# Patient Record
Sex: Male | Born: 1990 | Race: Black or African American | Hispanic: No | Marital: Single | Smoking: Never smoker
Health system: Southern US, Community
[De-identification: ages and names within clinical notes are randomized; demographics above are authoritative.]

## PROBLEM LIST (undated history)

## (undated) DIAGNOSIS — K3580 Unspecified acute appendicitis: Secondary | ICD-10-CM

## (undated) HISTORY — PX: TYMPANOSTOMY TUBE PLACEMENT: SHX32

## (undated) HISTORY — PX: TONSILLECTOMY: SUR1361

---

## 2013-05-30 ENCOUNTER — Encounter (HOSPITAL_COMMUNITY): Payer: Self-pay | Admitting: Emergency Medicine

## 2013-05-30 ENCOUNTER — Emergency Department (HOSPITAL_COMMUNITY): Payer: BC Managed Care – PPO

## 2013-05-30 ENCOUNTER — Ambulatory Visit (HOSPITAL_COMMUNITY)
Admission: EM | Admit: 2013-05-30 | Discharge: 2013-05-31 | Disposition: A | Discharge status: Home / Self Care | Payer: BC Managed Care – PPO | Attending: General Surgery | Admitting: General Surgery

## 2013-05-30 ENCOUNTER — Emergency Department (HOSPITAL_COMMUNITY)
Admission: EM | Admit: 2013-05-30 | Discharge: 2013-05-30 | Disposition: A | Discharge status: Nursing Home (Includes ICF) | Payer: BC Managed Care – PPO | Source: Home / Self Care

## 2013-05-30 ENCOUNTER — Emergency Department (HOSPITAL_COMMUNITY): Payer: BC Managed Care – PPO | Admitting: Anesthesiology

## 2013-05-30 ENCOUNTER — Encounter (HOSPITAL_COMMUNITY)
Admission: EM | Disposition: A | Discharge status: Home / Self Care | Payer: Self-pay | Source: Home / Self Care | Attending: Emergency Medicine

## 2013-05-30 ENCOUNTER — Encounter (HOSPITAL_COMMUNITY): Payer: Self-pay | Admitting: Anesthesiology

## 2013-05-30 DIAGNOSIS — K3580 Unspecified acute appendicitis: Secondary | ICD-10-CM | POA: Insufficient documentation

## 2013-05-30 DIAGNOSIS — R1031 Right lower quadrant pain: Secondary | ICD-10-CM

## 2013-05-30 HISTORY — PX: LAPAROSCOPIC APPENDECTOMY: SHX408

## 2013-05-30 HISTORY — DX: Unspecified acute appendicitis: K35.80

## 2013-05-30 LAB — COMPREHENSIVE METABOLIC PANEL
ALT: 21 U/L (ref 0–53)
AST: 18 U/L (ref 0–37)
Albumin: 4.3 g/dL (ref 3.5–5.2)
Alkaline Phosphatase: 53 U/L (ref 39–117)
GFR calc Af Amer: >90 mL/min (ref >90)
Glucose, Bld: 95 mg/dL (ref 70–99)
Potassium: 3.4 mEq/L — ABNORMAL LOW (ref 3.5–5.1)
Sodium: 138 mEq/L (ref 135–145)
Total Protein: 8.5 g/dL — ABNORMAL HIGH (ref 6.0–8.3)

## 2013-05-30 LAB — URINALYSIS, ROUTINE W REFLEX MICROSCOPIC
Glucose, UA: NEGATIVE mg/dL
Hgb urine dipstick: NEGATIVE
Specific Gravity, Urine: 1.027 (ref 1.005–1.030)
pH: 6.5 (ref 5.0–8.0)

## 2013-05-30 LAB — CBC WITH DIFFERENTIAL/PLATELET
Basophils Absolute: 0 10*3/uL (ref 0.0–0.1)
Eosinophils Absolute: 0.1 10*3/uL (ref 0.0–0.7)
Lymphs Abs: 3.1 10*3/uL (ref 0.7–4.0)
MCH: 27.8 pg (ref 26.0–34.0)
Neutrophils Relative %: 64 % (ref 43–77)
Platelets: 243 10*3/uL (ref 150–400)
RBC: 5.44 MIL/uL (ref 4.22–5.81)
WBC: 12.7 10*3/uL — ABNORMAL HIGH (ref 4.0–10.5)

## 2013-05-30 SURGERY — APPENDECTOMY, LAPAROSCOPIC
Anesthesia: General | Site: Abdomen | Wound class: Clean Contaminated

## 2013-05-30 MED ORDER — PROPOFOL 10 MG/ML IV BOLUS
INTRAVENOUS | Status: DC | PRN
  Administered 2013-05-30: 300 mg via INTRAVENOUS

## 2013-05-30 MED ORDER — LIDOCAINE HCL (CARDIAC) 20 MG/ML IV SOLN
INTRAVENOUS | Status: DC | PRN
  Administered 2013-05-30: 20 mg via INTRAVENOUS

## 2013-05-30 MED ORDER — SUFENTANIL CITRATE 50 MCG/ML IV SOLN
INTRAVENOUS | Status: DC | PRN
  Administered 2013-05-30: 10 ug via INTRAVENOUS
  Administered 2013-05-30 (×2): 15 ug via INTRAVENOUS

## 2013-05-30 MED ORDER — IOHEXOL 300 MG/ML  SOLN
25.0000 mL | INTRAMUSCULAR | Status: DC | PRN
  Administered 2013-05-30: 25 mL via ORAL

## 2013-05-30 MED ORDER — LACTATED RINGERS IV SOLN
INTRAVENOUS | Status: DC | PRN
  Administered 2013-05-30 (×3): via INTRAVENOUS

## 2013-05-30 MED ORDER — OXYCODONE HCL 5 MG/5ML PO SOLN: 5.0000 mg | Freq: Once | ORAL | Status: AC | PRN

## 2013-05-30 MED ORDER — OXYCODONE HCL 5 MG PO TABS
5.0000 mg | ORAL_TABLET | Freq: Once | ORAL | Status: AC | PRN
Start: 2013-05-30 — End: 2013-05-30
  Administered 2013-05-30: 5 mg via ORAL

## 2013-05-30 MED ORDER — BSS IO SOLN
INTRAOCULAR | Status: AC
  Filled 2013-05-30: qty 15

## 2013-05-30 MED ORDER — ONDANSETRON HCL 4 MG/2ML IJ SOLN: 4.0000 mg | Freq: Four times a day (QID) | INTRAMUSCULAR | Status: DC | PRN

## 2013-05-30 MED ORDER — OXYCODONE HCL 5 MG PO TABS
ORAL_TABLET | ORAL | Status: AC
  Filled 2013-05-30: qty 1

## 2013-05-30 MED ORDER — MORPHINE SULFATE 4 MG/ML IJ SOLN: 1.0000 mg | INTRAMUSCULAR | Status: DC | PRN

## 2013-05-30 MED ORDER — HYDROMORPHONE HCL PF 1 MG/ML IJ SOLN
INTRAMUSCULAR | Status: AC
  Filled 2013-05-30: qty 1

## 2013-05-30 MED ORDER — PIPERACILLIN-TAZOBACTAM 3.375 G IVPB
3.3750 g | Freq: Once | INTRAVENOUS | Status: AC
  Administered 2013-05-30: 3.375 g via INTRAVENOUS
  Filled 2013-05-30: qty 50

## 2013-05-30 MED ORDER — HYDROMORPHONE HCL PF 1 MG/ML IJ SOLN
0.2500 mg | INTRAMUSCULAR | Status: DC | PRN
  Administered 2013-05-30 (×4): 0.5 mg via INTRAVENOUS

## 2013-05-30 MED ORDER — SUCCINYLCHOLINE CHLORIDE 20 MG/ML IJ SOLN
INTRAMUSCULAR | Status: DC | PRN
  Administered 2013-05-30: 200 mg via INTRAVENOUS

## 2013-05-30 MED ORDER — GLYCOPYRROLATE 0.2 MG/ML IJ SOLN
INTRAMUSCULAR | Status: DC | PRN
  Administered 2013-05-30: .6 mg via INTRAVENOUS

## 2013-05-30 MED ORDER — ONDANSETRON HCL 4 MG/2ML IJ SOLN
4.0000 mg | INTRAMUSCULAR | Status: AC
  Administered 2013-05-30: 4 mg via INTRAVENOUS
  Filled 2013-05-30: qty 2

## 2013-05-30 MED ORDER — NEOSTIGMINE METHYLSULFATE 1 MG/ML IJ SOLN
INTRAMUSCULAR | Status: DC | PRN
  Administered 2013-05-30: 5 mg via INTRAVENOUS

## 2013-05-30 MED ORDER — ONDANSETRON HCL 4 MG/2ML IJ SOLN
INTRAMUSCULAR | Status: DC | PRN
  Administered 2013-05-30: 4 mg via INTRAVENOUS

## 2013-05-30 MED ORDER — POTASSIUM CHLORIDE IN NACL 20-0.9 MEQ/L-% IV SOLN
INTRAVENOUS | Status: DC
  Administered 2013-05-30: 100 mL/h via INTRAVENOUS
  Filled 2013-05-30 (×5): qty 1000

## 2013-05-30 MED ORDER — HYDROCODONE-ACETAMINOPHEN 5-325 MG PO TABS
1.0000 | ORAL_TABLET | ORAL | Status: DC | PRN
  Administered 2013-05-30 – 2013-05-31 (×2): 1 via ORAL
  Administered 2013-05-31: 2 via ORAL
  Filled 2013-05-30 (×2): qty 1
  Filled 2013-05-30: qty 2

## 2013-05-30 MED ORDER — MIDAZOLAM HCL 2 MG/2ML IJ SOLN: 0.5000 mg | Freq: Once | INTRAMUSCULAR | Status: DC | PRN

## 2013-05-30 MED ORDER — IOHEXOL 300 MG/ML  SOLN
100.0000 mL | Freq: Once | INTRAMUSCULAR | Status: AC | PRN
  Administered 2013-05-30: 100 mL via INTRAVENOUS

## 2013-05-30 MED ORDER — BUPIVACAINE-EPINEPHRINE 0.25% -1:200000 IJ SOLN
INTRAMUSCULAR | Status: DC | PRN
  Administered 2013-05-30: 30 mL

## 2013-05-30 MED ORDER — SODIUM CHLORIDE 0.9 % IV BOLUS (SEPSIS)
1000.0000 mL | Freq: Once | INTRAVENOUS | Status: AC
  Administered 2013-05-30: 1000 mL via INTRAVENOUS

## 2013-05-30 MED ORDER — MIDAZOLAM HCL 5 MG/5ML IJ SOLN
INTRAMUSCULAR | Status: DC | PRN
  Administered 2013-05-30: 2 mg via INTRAVENOUS

## 2013-05-30 MED ORDER — ROCURONIUM BROMIDE 100 MG/10ML IV SOLN
INTRAVENOUS | Status: DC | PRN
  Administered 2013-05-30: 10 mg via INTRAVENOUS
  Administered 2013-05-30: 30 mg via INTRAVENOUS
  Administered 2013-05-30 (×2): 10 mg via INTRAVENOUS

## 2013-05-30 MED ORDER — MORPHINE SULFATE 4 MG/ML IJ SOLN
4.0000 mg | Freq: Once | INTRAMUSCULAR | Status: AC
  Administered 2013-05-30: 4 mg via INTRAVENOUS
  Filled 2013-05-30: qty 1

## 2013-05-30 MED ORDER — SODIUM CHLORIDE 0.9 % IR SOLN
Status: DC | PRN
  Administered 2013-05-30 (×2): 1000 mL

## 2013-05-30 MED ORDER — 0.9 % SODIUM CHLORIDE (POUR BTL) OPTIME
TOPICAL | Status: DC | PRN
  Administered 2013-05-30: 1000 mL

## 2013-05-30 MED ORDER — PIPERACILLIN-TAZOBACTAM 3.375 G IVPB 30 MIN
3.3750 g | Freq: Three times a day (TID) | INTRAVENOUS | Status: AC
  Administered 2013-05-30: 3.375 g via INTRAVENOUS
  Filled 2013-05-30: qty 50

## 2013-05-30 MED ORDER — ENOXAPARIN SODIUM 40 MG/0.4ML ~~LOC~~ SOLN
40.0000 mg | SUBCUTANEOUS | Status: DC
  Filled 2013-05-30: qty 0.4

## 2013-05-30 MED ORDER — ACETAMINOPHEN 500 MG PO TABS
1000.0000 mg | ORAL_TABLET | Freq: Four times a day (QID) | ORAL | Status: DC
  Administered 2013-05-30 – 2013-05-31 (×3): 1000 mg via ORAL
  Filled 2013-05-30 (×4): qty 2

## 2013-05-30 MED ORDER — MEPERIDINE HCL 25 MG/ML IJ SOLN: 6.2500 mg | INTRAMUSCULAR | Status: DC | PRN

## 2013-05-30 MED ORDER — PROMETHAZINE HCL 25 MG/ML IJ SOLN: 6.2500 mg | INTRAMUSCULAR | Status: DC | PRN

## 2013-05-30 MED ORDER — HYDROMORPHONE HCL PF 1 MG/ML IJ SOLN
1.0000 mg | INTRAMUSCULAR | Status: DC | PRN
  Administered 2013-05-30: 1 mg via INTRAVENOUS
  Filled 2013-05-30: qty 1

## 2013-05-30 SURGICAL SUPPLY — 45 items
APPLIER CLIP ROT 10 11.4 M/L (STAPLE)
BLADE SURG ROTATE 9660 (MISCELLANEOUS) IMPLANT
CANISTER SUCTION 2500CC (MISCELLANEOUS) ×2 IMPLANT
CHLORAPREP W/TINT 26ML (MISCELLANEOUS) ×2 IMPLANT
CLIP APPLIE ROT 10 11.4 M/L (STAPLE) IMPLANT
CLOTH BEACON ORANGE TIMEOUT ST (SAFETY) ×2 IMPLANT
COVER SURGICAL LIGHT HANDLE (MISCELLANEOUS) ×2 IMPLANT
CUTTER LINEAR ENDO 35 ETS (STAPLE) IMPLANT
CUTTER LINEAR ENDO 35 ETS TH (STAPLE) ×2 IMPLANT
DECANTER SPIKE VIAL GLASS SM (MISCELLANEOUS) ×2 IMPLANT
DERMABOND ADVANCED (GAUZE/BANDAGES/DRESSINGS) ×1
DERMABOND ADVANCED .7 DNX12 (GAUZE/BANDAGES/DRESSINGS) ×1 IMPLANT
DRAPE UTILITY 15X26 W/TAPE STR (DRAPE) ×4 IMPLANT
DRSG TEGADERM 2-3/8X2-3/4 SM (GAUZE/BANDAGES/DRESSINGS) ×6 IMPLANT
ELECT REM PT RETURN 9FT ADLT (ELECTROSURGICAL) ×2
ELECTRODE REM PT RTRN 9FT ADLT (ELECTROSURGICAL) ×1 IMPLANT
ENDOLOOP SUT PDS II  0 18 (SUTURE)
ENDOLOOP SUT PDS II 0 18 (SUTURE) IMPLANT
GLOVE BIO SURGEON STRL SZ 6 (GLOVE) ×4 IMPLANT
GLOVE BIOGEL PI IND STRL 8 (GLOVE) ×1 IMPLANT
GLOVE BIOGEL PI INDICATOR 8 (GLOVE) ×1
GLOVE ECLIPSE 7.5 STRL STRAW (GLOVE) ×2 IMPLANT
GOWN STRL NON-REIN LRG LVL3 (GOWN DISPOSABLE) ×6 IMPLANT
IV NS 1000ML (IV SOLUTION) ×2
IV NS 1000ML BAXH (IV SOLUTION) ×2 IMPLANT
KIT BASIN OR (CUSTOM PROCEDURE TRAY) ×2 IMPLANT
KIT ROOM TURNOVER OR (KITS) ×2 IMPLANT
NS IRRIG 1000ML POUR BTL (IV SOLUTION) ×2 IMPLANT
PAD ARMBOARD 7.5X6 YLW CONV (MISCELLANEOUS) ×4 IMPLANT
PENCIL BUTTON HOLSTER BLD 10FT (ELECTRODE) IMPLANT
POUCH SPECIMEN RETRIEVAL 10MM (ENDOMECHANICALS) ×2 IMPLANT
RELOAD /EVU35 (ENDOMECHANICALS) IMPLANT
RELOAD CUTTER ETS 35MM STAND (ENDOMECHANICALS) ×2 IMPLANT
SET IRRIG TUBING LAPAROSCOPIC (IRRIGATION / IRRIGATOR) ×2 IMPLANT
SPECIMEN JAR SMALL (MISCELLANEOUS) ×2 IMPLANT
STRIP CLOSURE SKIN 1/2X4 (GAUZE/BANDAGES/DRESSINGS) ×2 IMPLANT
SUT MNCRL AB 4-0 PS2 18 (SUTURE) ×2 IMPLANT
TOWEL OR 17X24 6PK STRL BLUE (TOWEL DISPOSABLE) ×2 IMPLANT
TOWEL OR 17X26 10 PK STRL BLUE (TOWEL DISPOSABLE) ×2 IMPLANT
TRAY FOLEY CATH 16FR SILVER (SET/KITS/TRAYS/PACK) ×2 IMPLANT
TRAY LAPAROSCOPIC (CUSTOM PROCEDURE TRAY) ×2 IMPLANT
TROCAR XCEL 12X100 BLDLESS (ENDOMECHANICALS) ×2 IMPLANT
TROCAR XCEL BLUNT TIP 100MML (ENDOMECHANICALS) ×2 IMPLANT
TROCAR XCEL NON-BLD 5MMX100MML (ENDOMECHANICALS) ×2 IMPLANT
WATER STERILE IRR 1000ML POUR (IV SOLUTION) IMPLANT

## 2013-05-30 NOTE — Op Note (Signed)
OPERATIVE REPORT  DATE OF OPERATION: 05/30/2013  PATIENT:  Scott Cunningham  22 y.o. male  PRE-OPERATIVE DIAGNOSIS:  acute appendicitis  POST-OPERATIVE DIAGNOSIS:  acute appendicitis  PROCEDURE:  Procedure(s): APPENDECTOMY LAPAROSCOPIC  SURGEON:  Surgeon(s): Cherylynn Ridges, MD  ASSISTANT: None  ANESTHESIA:   general  EBL: 50 ml  BLOOD ADMINISTERED: none  DRAINS: none   SPECIMEN:  Source of Specimen:  Acutely inflammed appendix  COUNTS CORRECT:  YES  PROCEDURE DETAILS: The patient was taken to the operating room and placed on the table in the supine position.  After an adequate general endotracheal anesthetic was administered he was prepped and draped in the usual sterile manner exposing the entire abdomen.  After proper time out was performed identifying the patient and the procedure to be performed, a supraumbilical midline incision was made approximately 1/2-2 cm long using a #15 blade, and taken down to the midline fascia. We incised the midline fascia using a #15 blade and grabbed the edges with a Kocher clamp. We bluntly dissected down into the peritoneal cavity then passed a pursestring suture of 0 Vicryl around the fascial opening. A Hassan cannula was subsequently passed into the peritoneal cavity and secured in place with a pursestring suture.  Carbon dioxide gas was insufflated into the peritoneal cavity up to maximal intra-abdominal pressure 15 mm mercury. We subsequently passed a right upper quadrant 5 mm cannula in the left lower quadrant 1112 mm cannula under direct vision. The patient was placed in Trendelenburg position and the left side was tilted down.  The appendix was densely adhesed to the right lateral paracolic wall and the terminal ileum was draped over the appendix from adhesive bands and scar tissue. The portion of the procedure was been trying to dissect away the terminal ileum from the appendix which were able to do safely without any injury to the  bowel.  We were subsequently able to dissect out the mesoappendix and come across with an Endo GIA with 2.5 mm closure staples. We subsequently dissected the appendix away from surrounding structures and at the base of the cecum came across the appendix with a 3.5 mm blue cartridge Endo GIA. This completely detached appendix which we retrieved from the left lower quadrant site using an Endo Catch bag.  Once the appendix was removed we irrigated with approximately 750 cc of saline attaining adequate hemostasis using electrocautery. We subsequently removed the supraumbilical cannula using the pursestring suture to close off the fascial defect. We aspirated all fluid and gas from above the liver then closed.  Quarter percent Marcaine with an epinephrine was injected at all sites. We then closed the skin in the left lower quadrant in the suprabuccal site using running subcuticular stitch of 4-0 Monocryl. Dermabond Steri-Strips and Tegaderms use complete our dressings all needle counts, sponge counts, and instrument counts were correct.  PATIENT DISPOSITION:  PACU - hemodynamically stable.   Cherylynn Ridges 9/5/20145:06 PM

## 2013-05-30 NOTE — Transfer of Care (Signed)
Immediate Anesthesia Transfer of Care Note  Patient: Scott Cunningham  Procedure(s) Performed: Procedure(s): APPENDECTOMY LAPAROSCOPIC (N/A)  Patient Location: PACU  Anesthesia Type:General  Level of Consciousness: awake, oriented, sedated and patient cooperative  Airway & Oxygen Therapy: Patient Spontanous Breathing and Patient connected to face mask oxygen  Post-op Assessment: Report given to PACU RN, Post -op Vital signs reviewed and stable and Patient moving all extremities  Post vital signs: Reviewed and stable  Complications: No apparent anesthesia complications

## 2013-05-30 NOTE — ED Notes (Addendum)
Pt's mother phone #[phone], OR receiving RN Olegario Messier informed that Dr. Lynnea Maizes would like to speak with pt's mother.

## 2013-05-30 NOTE — Progress Notes (Signed)
Patient ID: Scott Cunningham, male   DOB: [birth year], 22 y.o.   MRN: [id number] Rome Echavarria [birth year]  [id number].   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  He woke up yesterday with RLQ abdominal pain.  He had 4 episodes of emesis last night, but none since then.  His pain has persisted with no fevers or chills.  He came to the [facility] today and underwent a work up that revealed acute appendicitis.  We have been asked to see him for intervention.  Review of Systems: Please see HPI, otherwise all other systems are negative  No family history on file.  History reviewed. No pertinent past medical history.  History reviewed. No pertinent past surgical history.  Social History:  reports that he has never smoked. He does not have any smokeless tobacco history on file. He reports that he does not drink alcohol or use illicit drugs.  Allergies: No Known Allergies   (Not in a hospital admission)  Blood pressure 122/47, pulse 91, temperature 99 F (37.2 C), temperature source Oral, resp. rate 20, height 5\' 9"  (1.753 m), weight 330 lb (149.687 kg), SpO2 100.00%. Physical Exam: General: pleasant, obese black male who is laying in bed in NAD HEENT: head is normocephalic, atraumatic.  Sclera are noninjected.  PERRL.  Ears and nose without any masses or lesions.  Mouth is pink and moist Heart: regular, rate, and rhythm.  Normal s1,s2. No obvious murmurs, gallops, or rubs noted.  Palpable radial and pedal pulses bilaterally Lungs: CTAB, no wheezes, rhonchi, or rales noted.  Respiratory effort nonlabored Abd: soft, tender in RLQ with no guarding or rebouding, ND, hypoactive BS, no masses, hernias, or organomegaly MS: all 4 extremities are symmetrical with no cyanosis, clubbing, or edema. Skin: warm and dry with no masses, lesions, or rashes Psych: A&Ox3 with an appropriate  affect.    Results for orders placed during the hospital encounter of 05/30/13 (from the past 48 hour(s))  CBC WITH DIFFERENTIAL     Status: Abnormal   Collection Time    05/30/13 11:25 AM      Result Value Range   WBC 12.7 (*) 4.0 - 10.5 K/uL   RBC 5.44  4.22 - 5.81 MIL/uL   Hemoglobin 15.1  13.0 - 17.0 g/dL   HCT 69.6  29.5 - 28.4 %   MCV 80.3  78.0 - 100.0 fL   MCH 27.8  26.0 - 34.0 pg   MCHC 34.6  30.0 - 36.0 g/dL   RDW 13.2  44.0 - 10.2 %   Platelets 243  150 - 400 K/uL   Neutrophils Relative % 64  43 - 77 %   Neutro Abs 8.2 (*) 1.7 - 7.7 K/uL   Lymphocytes Relative 25  12 - 46 %   Lymphs Abs 3.1  0.7 - 4.0 K/uL   Monocytes Relative 10  3 - 12 %   Monocytes Absolute 1.3 (*) 0.1 - 1.0 K/uL   Eosinophils Relative 1  0 - 5 %   Eosinophils Absolute 0.1  0.0 - 0.7 K/uL   Basophils Relative 0  0 - 1 %   Basophils Absolute 0.0  0.0 - 0.1 K/uL  COMPREHENSIVE METABOLIC PANEL     Status: Abnormal   Collection Time    05/30/13 11:25 AM      Result Value Range   Sodium 138  135 - 145 mEq/L  Potassium 3.4 (*) 3.5 - 5.1 mEq/L   Chloride 99  96 - 112 mEq/L   CO2 27  19 - 32 mEq/L   Glucose, Bld 95  70 - 99 mg/dL   BUN 7  6 - 23 mg/dL   Creatinine, Ser 1.61  0.50 - 1.35 mg/dL   Calcium 09.6  8.4 - 04.5 mg/dL   Total Protein 8.5 (*) 6.0 - 8.3 g/dL   Albumin 4.3  3.5 - 5.2 g/dL   AST 18  0 - 37 U/L   ALT 21  0 - 53 U/L   Alkaline Phosphatase 53  39 - 117 U/L   Total Bilirubin 0.6  0.3 - 1.2 mg/dL   GFR calc non Af Amer >90  >90 mL/min   GFR calc Af Amer >90  >90 mL/min   Comment: (NOTE)     The eGFR has been calculated using the CKD EPI equation.     This calculation has not been validated in all clinical situations.     eGFR's persistently <90 mL/min signify possible Chronic Kidney     Disease.  LIPASE, BLOOD     Status: None   Collection Time    05/30/13 11:25 AM      Result Value Range   Lipase 15  11 - 59 U/L  URINALYSIS, ROUTINE W REFLEX MICROSCOPIC     Status:  Abnormal   Collection Time    05/30/13 11:31 AM      Result Value Range   Color, Urine AMBER (*) YELLOW   Comment: BIOCHEMICALS MAY BE AFFECTED BY COLOR   APPearance CLEAR  CLEAR   Specific Gravity, Urine 1.027  1.005 - 1.030   pH 6.5  5.0 - 8.0   Glucose, UA NEGATIVE  NEGATIVE mg/dL   Hgb urine dipstick NEGATIVE  NEGATIVE   Bilirubin Urine SMALL (*) NEGATIVE   Ketones, ur 15 (*) NEGATIVE mg/dL   Protein, ur NEGATIVE  NEGATIVE mg/dL   Urobilinogen, UA 2.0 (*) 0.0 - 1.0 mg/dL   Nitrite NEGATIVE  NEGATIVE   Leukocytes, UA NEGATIVE  NEGATIVE   Comment: MICROSCOPIC NOT DONE ON URINES WITH NEGATIVE PROTEIN, BLOOD, LEUKOCYTES, NITRITE, OR GLUCOSE <1000 mg/dL.   Ct Abdomen Pelvis W Contrast  05/30/2013   *RADIOLOGY REPORT*  Clinical Data: Lower abdominal pain with nausea and vomiting  CT ABDOMEN AND PELVIS WITH CONTRAST  Technique:  Multidetector CT imaging of the abdomen and pelvis was performed following the standard protocol during bolus administration of intravenous contrast. Oral contrast was also administered.  Contrast: OMNIPAQUE IOHEXOL 300 MG/ML  SOLN  Comparison: None.  Findings: Lung bases are clear.  There is mild fatty infiltration near the fissure for the ligamentum teres.  Liver otherwise appears normal.  There is no biliary duct dilatation.  Spleen, pancreas, adrenals, and kidneys appear normal.  In the pelvis, there is no mass or fluid collection.  The appendix is dilated with extensive periappendiceal inflammation consistent with acute appendicitis. There is a nearby thickening of the terminal ileal wall, probably due to the inflammation from the appendicitis.  There is no bowel obstruction.  No free air or portal venous air.  There is no ascites, adenopathy, or abscess in the pelvis.  Aorta is nonaneurysmal.  There are no blastic or lytic bone lesions.  IMPRESSION: Evidence of acute appendicitis.  There is thickening of the wall of the terminal ileum, probably due to the  surrounding periappendiceal inflammation.  Critical Value/emergent results were called by telephone at  the time of interpretation on May 30, 2013 at t01:24 p.m. to [facility], who verbally acknowledged these results.   Original Report Authenticated By: Bretta Bang, M.D.       Assessment/Plan 1. Acute appendicitis   Plan: 1. We will get the patient admitted and plan for lap appy today, possible open, for acute appendicitis.  I have discussed the procedure including risks, complications, and outcome with the patient.  All questions have been answered.  The patient understands and is agreeable to proceed.  Kaylianna Detert E 05/30/2013, 2:15 PM Pager: [phone]

## 2013-05-30 NOTE — ED Notes (Signed)
C/o RLQ pain with n/v. Acute onset yesterday. Pt has tried otc meds with no relief in symptoms.  Denies fever and diarrhea.

## 2013-05-30 NOTE — Anesthesia Procedure Notes (Signed)
Procedure Name: Intubation Date/Time: 05/30/2013 3:34 PM Performed by: Coralee Rud Pre-anesthesia Checklist: Patient identified, Emergency Drugs available, Suction available, Patient being monitored and Timeout performed Patient Re-evaluated:Patient Re-evaluated prior to inductionOxygen Delivery Method: Circle system utilized Preoxygenation: Pre-oxygenation with 100% oxygen Intubation Type: IV induction Ventilation: Mask ventilation without difficulty Laryngoscope Size: Miller and 3 Grade View: Grade II Tube type: Oral Tube size: 8.0 mm Number of attempts: 1 Airway Equipment and Method: Stylet Placement Confirmation: ETT inserted through vocal cords under direct vision,  positive ETCO2 and breath sounds checked- equal and bilateral Secured at: 24 cm Tube secured with: Tape Dental Injury: Teeth and Oropharynx as per pre-operative assessment

## 2013-05-30 NOTE — ED Provider Notes (Signed)
CSN: [id number]     Arrival date & time 05/30/13  1044 History   First MD Initiated Contact with Patient 05/30/13 1108     Chief Complaint  Patient presents with  . Abdominal Pain  . Nausea  . Emesis   (Consider location/radiation/quality/duration/timing/severity/associated sxs/prior Treatment) HPI Comments: Patient is a 22 year old male with no significant past medical or surgical history who presents for right lower quadrant pain onset yesterday morning. Patient states the pain is sharp, stabbing, aching and intermittent. Pain is nonradiating. Patient states the pain is worse when he is moving and when he is walking. Pain is mildly alleviated when at rest. Patient has taken Tylenol and Pepto-Bismol for his symptoms without relief. Patient denies nausea or vomiting with symptoms, however, revealed to urgent care prior to arrival that he had experienced 4 episodes of emesis. Patient further denies fever, diarrhea, melena or hematochezia, urinary symptoms, and chest pain or shortness of breath. Patient states his last bowel movement was 2 days ago which was normal in color and consistency.  Patient is a 22 y.o. male presenting with abdominal pain and vomiting. The history is provided by the patient. No language interpreter was used.  Abdominal Pain Associated symptoms: vomiting   Associated symptoms: no diarrhea, no dysuria, no fever, no hematuria and no nausea   Emesis Associated symptoms: abdominal pain   Associated symptoms: no diarrhea     History reviewed. No pertinent past medical history. History reviewed. No pertinent past surgical history. No family history on file. History  Substance Use Topics  . Smoking status: Never Smoker   . Smokeless tobacco: Not on file  . Alcohol Use: No    Review of Systems  Constitutional: Negative for fever.  Gastrointestinal: Positive for vomiting and abdominal pain. Negative for nausea and diarrhea.  Genitourinary: Negative for dysuria and  hematuria.  All other systems reviewed and are negative.   Allergies  Review of patient's allergies indicates no known allergies.  Home Medications   Current Outpatient Rx  Name  Route  Sig  Dispense  Refill  . acetaminophen (TYLENOL) 500 MG tablet   Oral   Take 500 mg by mouth every 6 (six) hours as needed for pain.         Marland Kitchen bismuth subsalicylate (PEPTO BISMOL) 262 MG/15ML suspension   Oral   Take 15 mLs by mouth every 6 (six) hours as needed for indigestion.          BP 109/49  Pulse 95  Temp(Src) 99 F (37.2 C) (Oral)  Resp 20  Ht 5\' 9"  (1.753 m)  Wt 330 lb (149.687 kg)  BMI 48.71 kg/m2  SpO2 97%  Physical Exam  Nursing note and vitals reviewed. Constitutional: He is oriented to person, place, and time. He appears well-developed and well-nourished. No distress.  HENT:  Head: Normocephalic and atraumatic.  Mouth/Throat: Oropharynx is clear and moist. No oropharyngeal exudate.  Eyes: Conjunctivae and EOM are normal. Pupils are equal, round, and reactive to light. No scleral icterus.  Neck: Normal range of motion.  Cardiovascular: Normal rate, regular rhythm and intact distal pulses.   Pulmonary/Chest: Effort normal and breath sounds normal. No respiratory distress. He has no wheezes. He has no rales.  Abdominal: Soft. He exhibits no mass. There is tenderness (Focal tenderness and right lower quadrant with deep palpation). There is no rebound and no guarding.  No peritoneal signs or evidence of acute surgical abdomen  Musculoskeletal: Normal range of motion.  Neurological: He is alert and  oriented to person, place, and time.  Skin: Skin is warm and dry. No rash noted. He is not diaphoretic. No erythema. No pallor.  Psychiatric: He has a normal mood and affect. His behavior is normal.    ED Course  Procedures (including critical care time) Labs Review Labs Reviewed  CBC WITH DIFFERENTIAL - Abnormal; Notable for the following:    WBC 12.7 (*)    Neutro Abs 8.2  (*)    Monocytes Absolute 1.3 (*)    All other components within normal limits  COMPREHENSIVE METABOLIC PANEL - Abnormal; Notable for the following:    Potassium 3.4 (*)    Total Protein 8.5 (*)    All other components within normal limits  URINALYSIS, ROUTINE W REFLEX MICROSCOPIC - Abnormal; Notable for the following:    Color, Urine AMBER (*)    Bilirubin Urine SMALL (*)    Ketones, ur 15 (*)    Urobilinogen, UA 2.0 (*)    All other components within normal limits  LIPASE, BLOOD   Imaging Review Ct Abdomen Pelvis W Contrast  05/30/2013   *RADIOLOGY REPORT*  Clinical Data: Lower abdominal pain with nausea and vomiting  CT ABDOMEN AND PELVIS WITH CONTRAST  Technique:  Multidetector CT imaging of the abdomen and pelvis was performed following the standard protocol during bolus administration of intravenous contrast. Oral contrast was also administered.  Contrast: OMNIPAQUE IOHEXOL 300 MG/ML  SOLN  Comparison: None.  Findings: Lung bases are clear.  There is mild fatty infiltration near the fissure for the ligamentum teres.  Liver otherwise appears normal.  There is no biliary duct dilatation.  Spleen, pancreas, adrenals, and kidneys appear normal.  In the pelvis, there is no mass or fluid collection.  The appendix is dilated with extensive periappendiceal inflammation consistent with acute appendicitis. There is a nearby thickening of the terminal ileal wall, probably due to the inflammation from the appendicitis.  There is no bowel obstruction.  No free air or portal venous air.  There is no ascites, adenopathy, or abscess in the pelvis.  Aorta is nonaneurysmal.  There are no blastic or lytic bone lesions.  IMPRESSION: Evidence of acute appendicitis.  There is thickening of the wall of the terminal ileum, probably due to the surrounding periappendiceal inflammation.  Critical Value/emergent results were called by telephone at the time of interpretation on May 30, 2013 at t01:24 p.m. to  [facility], who verbally acknowledged these results.   Original Report Authenticated By: Bretta Bang, M.D.   MDM   1. Acute appendicitis    Patient with right lower quadrant pain x2 days. Symptoms managed in ED with IVF, IV morphine and IV Zofran. Labs significant for leukocytosis of 12.7. CT scan of evidence of acute appendicitis with thickening of the terminal ileum wall. Radiologist questions perforation; however patient has no involuntary guarding or peritoneal signs on initial physical exam or reexamination. Will consult general surgery for admission. IV Zosyn ordered.   General surgery to evaluate and admit.  Antony Madura, PA-C 05/30/13 1336

## 2013-05-30 NOTE — Anesthesia Preprocedure Evaluation (Addendum)
Anesthesia Evaluation  Patient identified by MRN, date of birth, ID band Patient awake    Reviewed: Allergy & Precautions, H&P , NPO status , Patient's Chart, lab work & pertinent test results, reviewed documented beta blocker date and time   History of Anesthesia Complications Negative for: history of anesthetic complications  Airway Mallampati: II TM Distance: >3 FB Neck ROM: Full    Dental  (+) Teeth Intact and Dental Advisory Given   Pulmonary Recent URI , Resolved,  breath sounds clear to auscultation  Pulmonary exam normal       Cardiovascular negative cardio ROS  Rhythm:Regular Rate:Normal     Neuro/Psych negative neurological ROS     GI/Hepatic Neg liver ROS, N/V with acute appy   Endo/Other  Morbid obesity  Renal/GU negative Renal ROS     Musculoskeletal   Abdominal (+) + obese,   Peds  Hematology   Anesthesia Other Findings   Reproductive/Obstetrics                         Anesthesia Physical Anesthesia Plan  ASA: III and emergent  Anesthesia Plan: General   Post-op Pain Management:    Induction: Intravenous  Airway Management Planned: Oral ETT  Additional Equipment:   Intra-op Plan:   Post-operative Plan: Extubation in OR  Informed Consent: I have reviewed the patients History and Physical, chart, labs and discussed the procedure including the risks, benefits and alternatives for the proposed anesthesia with the patient or authorized representative who has indicated his/her understanding and acceptance.   Dental advisory given  Plan Discussed with: CRNA and Surgeon  Anesthesia Plan Comments: (Plan routine monitors, GETA with RSI)       Anesthesia Quick Evaluation

## 2013-05-30 NOTE — Anesthesia Postprocedure Evaluation (Signed)
  Anesthesia Post-op Note  Patient: Scott Cunningham  Procedure(s) Performed: Procedure(s): APPENDECTOMY LAPAROSCOPIC (N/A)  Patient Location: PACU  Anesthesia Type:General  Level of Consciousness: awake, alert  and oriented  Airway and Oxygen Therapy: Patient Spontanous Breathing and Patient connected to nasal cannula oxygen  Post-op Pain: mild  Post-op Assessment: Post-op Vital signs reviewed, Patient's Cardiovascular Status Stable, Respiratory Function Stable, Patent Airway and Pain level controlled  Post-op Vital Signs: stable  Complications: No apparent anesthesia complications

## 2013-05-30 NOTE — Progress Notes (Signed)
Patient has acute appendicitis without perforation.  For lap appy soon.  Scott Cunningham. Gae Bon, MD, FACS [phone] [facility]

## 2013-05-30 NOTE — ED Provider Notes (Signed)
CSN: [id number]     Arrival date & time 05/30/13  2130 History   First MD Initiated Contact with Patient 05/30/13 1014     Chief Complaint  Patient presents with  . Abdominal Pain    RLQ pain acute onset with n/v   (Consider location/radiation/quality/duration/timing/severity/associated sxs/prior Treatment) HPI Comments: 22 year old male developed right lower quadrant pain yesterday morning. The pain has been primarily constant throughout the remainder of yesterday last night and today. It is often exacerbated by movement sitting and lying supine. Sometimes it is improved with standing. Yesterday evening he experienced 4 episodes of vomiting, 2 of those episodes were after consuming Pepto-Bismol. He has had a decreased appetite over the past 24 hours. He states he has been lying balled up on the bed having pain. Denies any known fever.   History reviewed. No pertinent past medical history. History reviewed. No pertinent past surgical history. History reviewed. No pertinent family history. History  Substance Use Topics  . Smoking status: Never Smoker   . Smokeless tobacco: Not on file  . Alcohol Use: No    Review of Systems  Constitutional: Positive for activity change and appetite change. Negative for fever.  HENT: Negative.   Respiratory: Negative.   Cardiovascular: Negative.   Gastrointestinal: Positive for nausea, vomiting and abdominal pain. Negative for diarrhea and constipation.  Genitourinary: Negative.   Skin: Negative for rash.  Neurological: Negative.     Allergies  Review of patient's allergies indicates no known allergies.  Home Medications  No current outpatient prescriptions on file. BP 125/91  Pulse 116  Temp(Src) 98.4 F (36.9 C) (Oral)  Resp 20  SpO2 100% Physical Exam  Nursing note and vitals reviewed. Constitutional: He is oriented to person, place, and time. He appears well-developed and well-nourished. No distress.  Neck: Normal range of motion. Neck  supple.  Cardiovascular: Normal rate, regular rhythm and normal heart sounds.   Pulmonary/Chest: Breath sounds normal. He is in respiratory distress. He has no wheezes.  Abdominal: Soft. He exhibits no mass. There is tenderness. There is guarding. There is no rebound.  There is right lower quadrant tenderness with guarding. No rebound. Supine on the bed tapping on the bottom of the heel produces pain in the right lower quadrant, possibly suggesting peritoneal irritation.  Musculoskeletal: He exhibits no edema.  Neurological: He is alert and oriented to person, place, and time. He exhibits normal muscle tone.  Skin: Skin is warm.  Abdomen is moist during exam.  Psychiatric: He has a normal mood and affect.    ED Course  Procedures (including critical care time) Labs Review Labs Reviewed - No data to display Imaging Review No results found.  MDM   1. Right lower quadrant abdominal pain     This 22 year old male is being transferred to the tone when she department for evaluation of right lower quadrant pain began yesterday morning.   it hasbeen persistent throughout the past  24 hours. Last p.m. he had 4 episodes of vomiting but this did not persist throughout the night or  Morning. Differential includes appendicitis, gas, constipation.  Hayden Rasmussen, NP 05/30/13 1037

## 2013-05-30 NOTE — ED Provider Notes (Signed)
Medical screening examination/treatment/procedure(s) were performed by non-physician practitioner and as supervising physician I was immediately available for consultation/collaboration.  Shelda Jakes, MD 05/30/13 [phone]

## 2013-05-30 NOTE — ED Notes (Signed)
Pt c/o abdominal pain with n/v onset yesterday. Pt transferred from urgent care for further eval.

## 2013-05-30 NOTE — Preoperative (Signed)
Beta Blockers   Reason not to administer Beta Blockers:Not Applicable 

## 2013-05-31 ENCOUNTER — Encounter (HOSPITAL_COMMUNITY): Payer: Self-pay | Admitting: *Deleted

## 2013-05-31 MED ORDER — HYDROCODONE-ACETAMINOPHEN 5-325 MG PO TABS: 1.0000 | ORAL_TABLET | ORAL | Status: DC | PRN

## 2013-05-31 NOTE — ED Provider Notes (Signed)
Medical screening examination/treatment/procedure(s) were performed by resident physician or non-physician practitioner and as supervising physician I was immediately available for consultation/collaboration.   Prarthana Parlin DOUGLAS MD.   Yuvonne Lanahan D Caileigh Canche, MD 05/31/13 1107 

## 2013-05-31 NOTE — Progress Notes (Signed)
1 Day Post-Op   Assessment: s/p Procedure(s): APPENDECTOMY LAPAROSCOPIC Patient Active Problem List   Diagnosis Date Noted  . Appendicitis, acute 05/30/2013    Doing well and able to go home  Plan: Discharge  Subjective: Feels good, sitting up in chair, polished off nice breakfast, pain good control, wants to go home  Objective: Vital signs in last 24 hours: Temp:  [97.6 F (36.4 C)-99.9 F (37.7 C)] 98.4 F (36.9 C) (09/06 1000) Pulse Rate:  [75-104] 75 (09/06 1000) Resp:  [15-23] 18 (09/06 1000) BP: (109-138)/(43-91) 128/60 mmHg (09/06 1000) SpO2:  [91 %-100 %] 96 % (09/06 1000) Weight:  [330 lb (149.687 kg)] 330 lb (149.687 kg) (09/05 1048)   Intake/Output from previous day: 09/05 0701 - 09/06 0700 In: 2558.3 [P.O.:200; I.V.:2358.3] Out: 1050 [Urine:1050]  General appearance: alert, cooperative and no distress Resp: clear to auscultation bilaterally GI: soft, non-tender; bowel sounds normal; no masses,  no organomegaly  Incision: healing well  Lab Results:   Recent Labs  05/30/13 1125  WBC 12.7*  HGB 15.1  HCT 43.7  PLT 243   BMET  Recent Labs  05/30/13 1125  NA 138  K 3.4*  CL 99  CO2 27  GLUCOSE 95  BUN 7  CREATININE 0.84  CALCIUM 10.0    MEDS, Scheduled . acetaminophen  1,000 mg Oral Q6H  . enoxaparin (LOVENOX) injection  40 mg Subcutaneous Q24H    Studies/Results: Ct Abdomen Pelvis W Contrast  05/30/2013   *RADIOLOGY REPORT*  Clinical Data: Lower abdominal pain with nausea and vomiting  CT ABDOMEN AND PELVIS WITH CONTRAST  Technique:  Multidetector CT imaging of the abdomen and pelvis was performed following the standard protocol during bolus administration of intravenous contrast. Oral contrast was also administered.  Contrast: OMNIPAQUE IOHEXOL 300 MG/ML  SOLN  Comparison: None.  Findings: Lung bases are clear.  There is mild fatty infiltration near the fissure for the ligamentum teres.  Liver otherwise appears normal.  There is  no biliary duct dilatation.  Spleen, pancreas, adrenals, and kidneys appear normal.  In the pelvis, there is no mass or fluid collection.  The appendix is dilated with extensive periappendiceal inflammation consistent with acute appendicitis. There is a nearby thickening of the terminal ileal wall, probably due to the inflammation from the appendicitis.  There is no bowel obstruction.  No free air or portal venous air.  There is no ascites, adenopathy, or abscess in the pelvis.  Aorta is nonaneurysmal.  There are no blastic or lytic bone lesions.  IMPRESSION: Evidence of acute appendicitis.  There is thickening of the wall of the terminal ileum, probably due to the surrounding periappendiceal inflammation.  Critical Value/emergent results were called by telephone at the time of interpretation on May 30, 2013 at t01:24 p.m. to [facility], who verbally acknowledged these results.   Original Report Authenticated By: Bretta Bang, M.D.      LOS: 1 day     Currie Paris, MD, [facility], [place] [phone]   05/31/2013 10:24 AM

## 2013-06-02 ENCOUNTER — Telehealth (INDEPENDENT_AMBULATORY_CARE_PROVIDER_SITE_OTHER): Payer: Self-pay | Admitting: *Deleted

## 2013-06-02 ENCOUNTER — Encounter (HOSPITAL_COMMUNITY): Payer: Self-pay | Admitting: General Surgery

## 2013-06-02 ENCOUNTER — Telehealth (INDEPENDENT_AMBULATORY_CARE_PROVIDER_SITE_OTHER): Payer: Self-pay

## 2013-06-02 NOTE — Telephone Encounter (Signed)
Message copied by Brennan Bailey on Mon Jun 02, 2013  4:43 PM ------      Message from: Louie Casa      Created: Mon Jun 02, 2013 11:01 AM      Regarding: Dr. Tarri Glenn to School and Work      Contact: [phone]       Patient had Surgery for appendicitis on May 30, 2013 with       Dr. Lindie Spruce and need to know when can go back to school and work , please if you can call him.            Thank you . ------

## 2013-06-02 NOTE — Telephone Encounter (Signed)
Setup PO appt for patient in DOW 9/16 @ 200p.  Called number listed for patient however this is a non working number.  Called and left message on mother's number to have her and patient to give [place] a call back so we can update patient on PO appt.

## 2013-06-02 NOTE — Telephone Encounter (Signed)
Tried calling patient back but phone was static and couldn't hear patient. Return to school/work 1-2 weeks depending on how he feels. No lifting over 20 lbs 3-4 weeks.

## 2013-06-04 ENCOUNTER — Encounter (INDEPENDENT_AMBULATORY_CARE_PROVIDER_SITE_OTHER): Payer: Self-pay

## 2013-06-04 ENCOUNTER — Telehealth (INDEPENDENT_AMBULATORY_CARE_PROVIDER_SITE_OTHER): Payer: Self-pay

## 2013-06-04 NOTE — Telephone Encounter (Signed)
Called pt and got dates of return. Notes typed and put up front desk for pick up.

## 2013-06-04 NOTE — Telephone Encounter (Signed)
Message copied by Brennan Bailey on Wed Jun 04, 2013 10:22 AM ------      Message from: [facility]      Created: Wed Jun 04, 2013  9:37 AM      Contact: [phone]       He needs and note for school and work please call him. ------

## 2013-06-10 ENCOUNTER — Encounter (INDEPENDENT_AMBULATORY_CARE_PROVIDER_SITE_OTHER): Payer: Self-pay | Admitting: *Deleted

## 2013-06-10 ENCOUNTER — Ambulatory Visit (INDEPENDENT_AMBULATORY_CARE_PROVIDER_SITE_OTHER): Payer: BC Managed Care – PPO | Admitting: General Surgery

## 2013-06-10 ENCOUNTER — Encounter (INDEPENDENT_AMBULATORY_CARE_PROVIDER_SITE_OTHER): Payer: Self-pay

## 2013-06-10 VITALS — BP 128/74 | HR 72 | Temp 97.4°F | Resp 14 | Ht 69.25 in | Wt 344.6 lb

## 2013-06-10 DIAGNOSIS — K3580 Unspecified acute appendicitis: Secondary | ICD-10-CM

## 2013-06-10 NOTE — Patient Instructions (Addendum)
Follow up as needed

## 2013-06-10 NOTE — Progress Notes (Signed)
JAYLYN BOOHER 08-05-1991 [id number] 06/10/2013   History of Present Illness: Scott Cunningham is a  22 y.o. male who presents today status post lap appy by Dr. Lindie Spruce.  Pathology reveals acute appendicitis.  The patient is tolerating a regular diet, having normal bowel movements, has good pain control.  He  is back to most normal activities.   Physical Exam: Abd: soft, nontender, active bowel sounds, nondistended.  All incisions are well healed.  Impression: 1.  Acute appendicitis, s/p lap appy  Plan: He  is able to return to normal activities. He  may follow up on a prn basis.

## 2014-03-18 IMAGING — CT CT ABD-PELV W/ CM
2 of 4 series · 15 of 46 positions shown, 17 images · IV contrast (APPLIED)
Comparison: None.

CLINICAL DATA: Lower abdominal pain with nausea and vomiting

CT ABDOMEN AND PELVIS WITH CONTRAST
TECHNIQUE: Multidetector CT imaging of the abdomen and pelvis was
performed following the standard protocol during bolus
administration of intravenous contrast. Oral contrast was also
administered.
Contrast: 100mL OMNIPAQUE IOHEXOL 300 MG/ML  SOLN

[Series 2: abd/ pelvis 5.0 i30f 1 · axial · 0.87mm/px · z∈[+354,+809]mm · 12 of 101 slices shown, 14 images]
[im 5/101  soft-tissue]
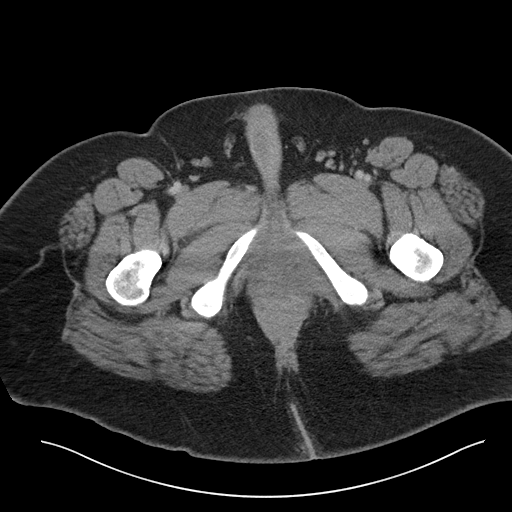
[im 5/101  bone]
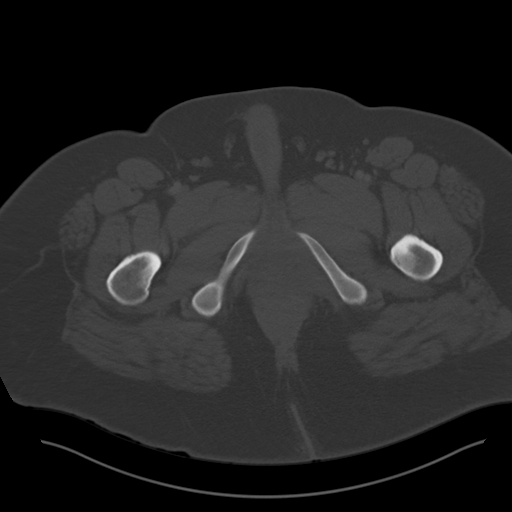
[im 13/101  soft-tissue]
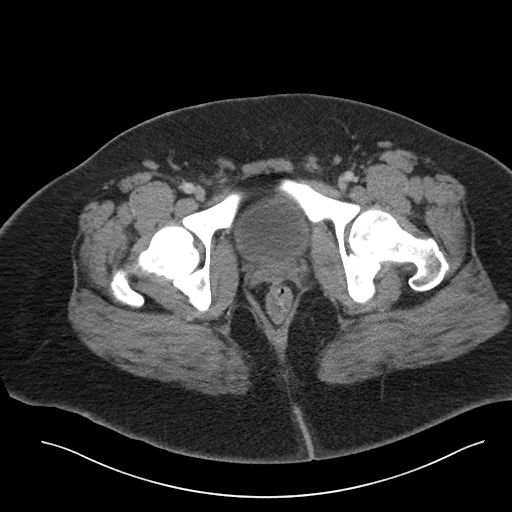
[im 21/101  soft-tissue]
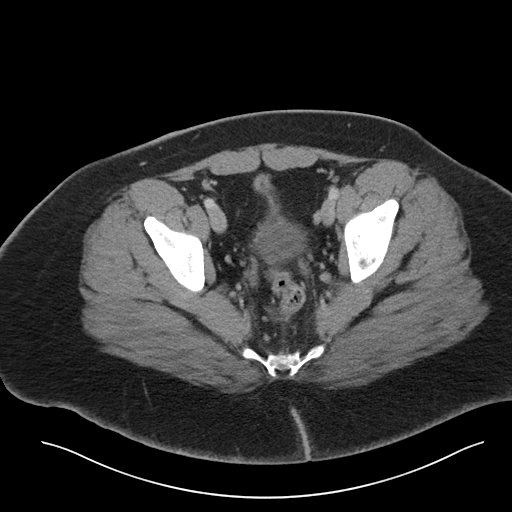
[im 30/101  soft-tissue]
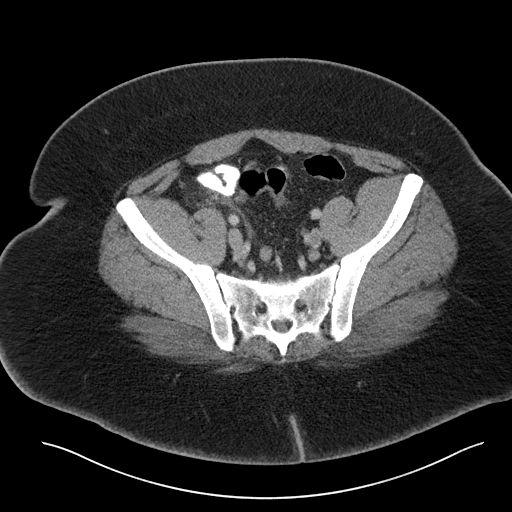
[im 38/101  soft-tissue]
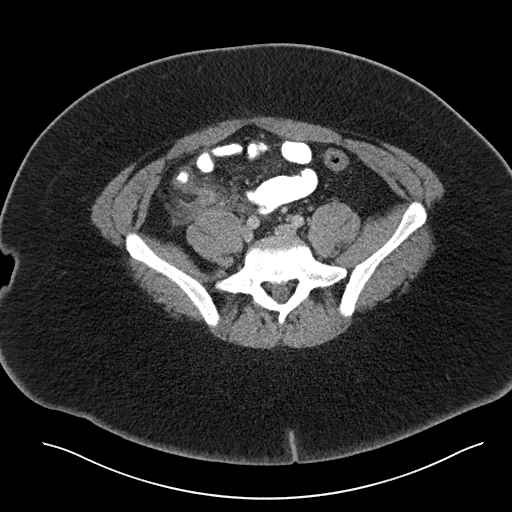
[im 46/101  soft-tissue]
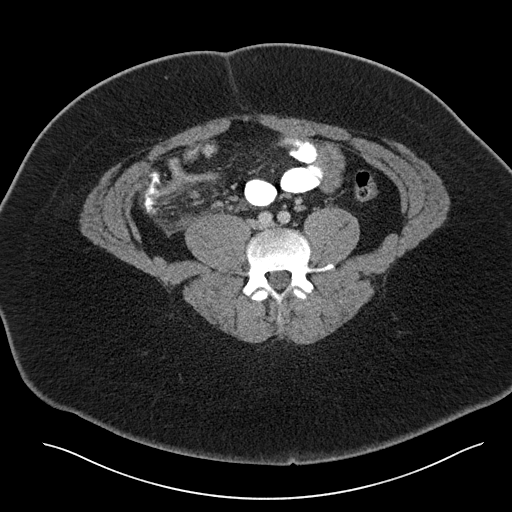
[im 55/101  soft-tissue]
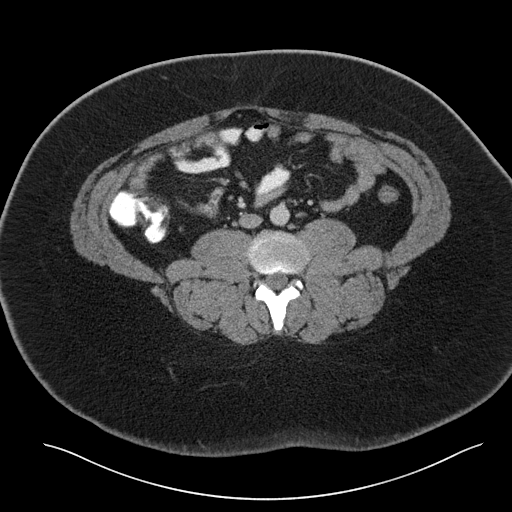
[im 63/101  soft-tissue]
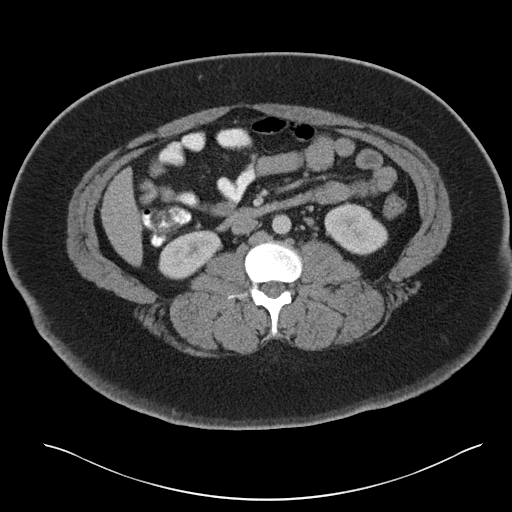
[im 71/101  soft-tissue]
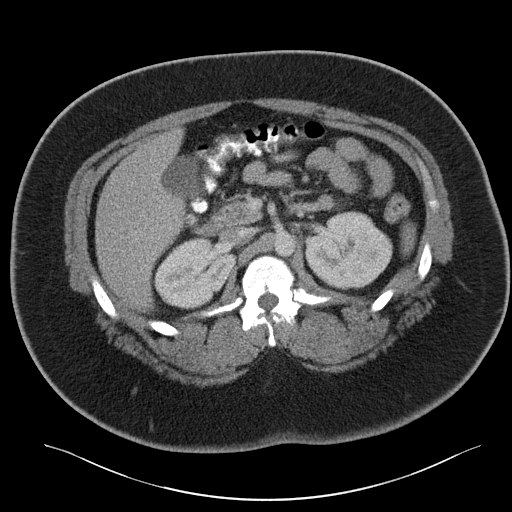
[im 71/101  bone]
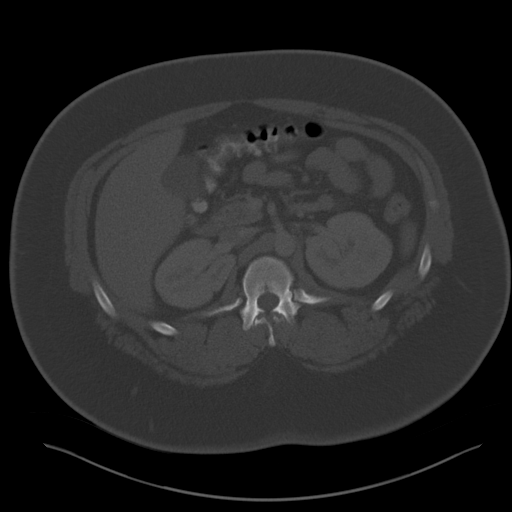
[im 80/101  soft-tissue]
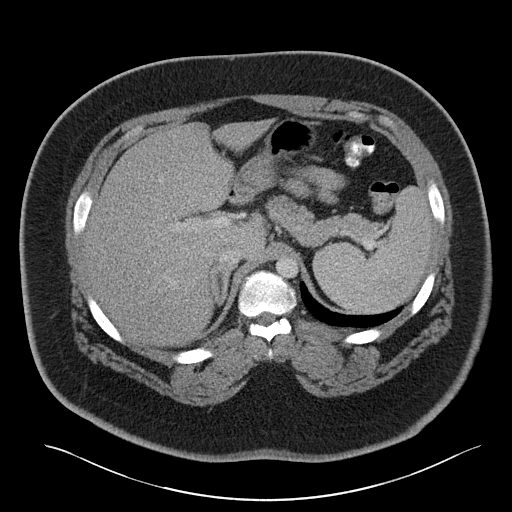
[im 88/101  soft-tissue]
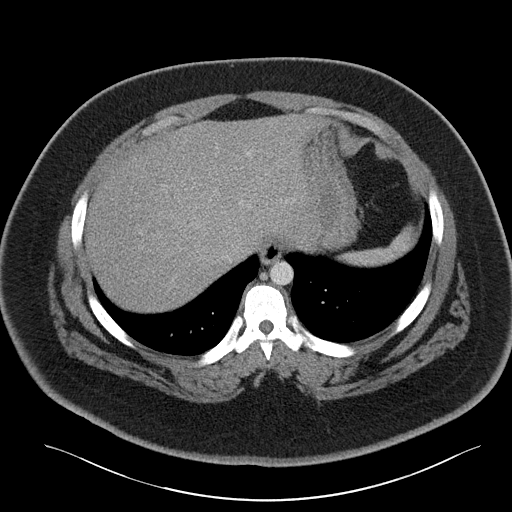
[im 96/101  soft-tissue]
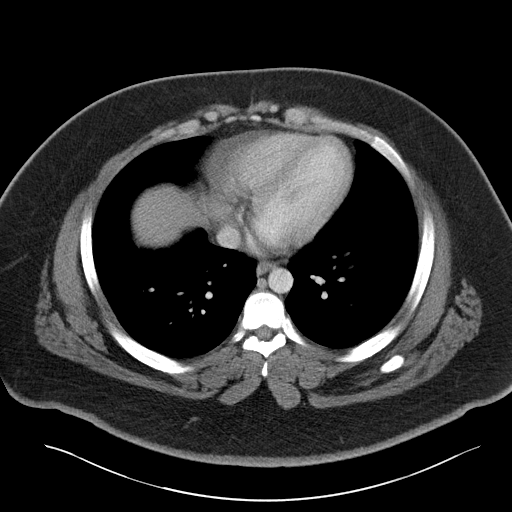

[Series 4: cor · coronal · 0.79mm/px · 3 of 94 slices shown]
[im 32/94  soft-tissue]
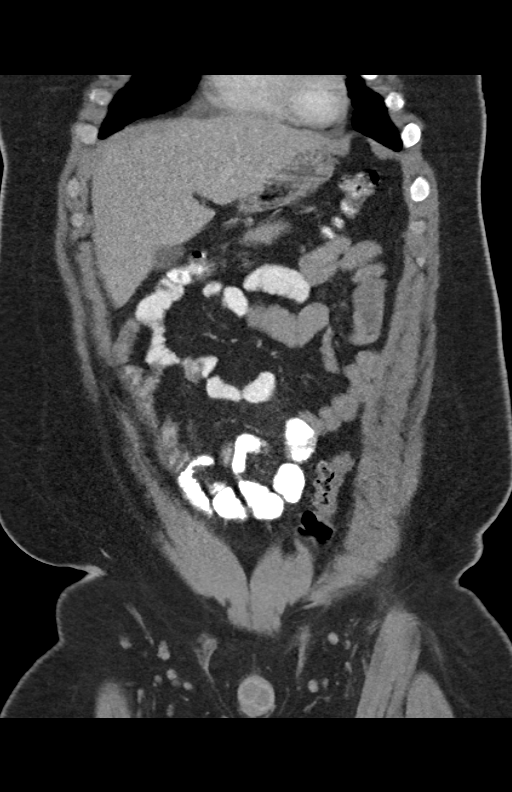
[im 42/94  soft-tissue]
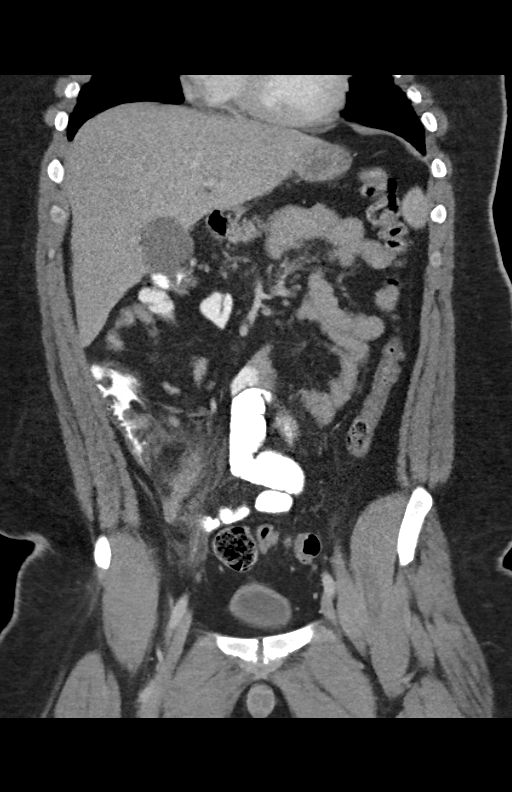
[im 52/94  soft-tissue]
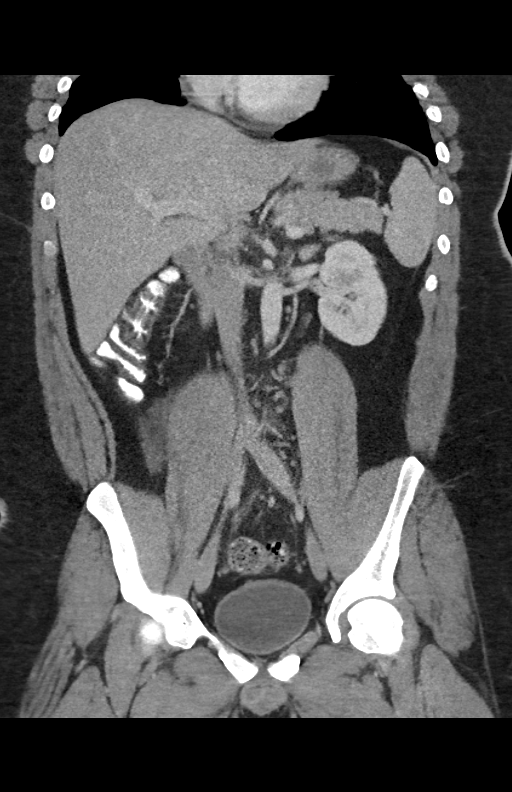

[15 of 46 positions shown; findings below may reference images not displayed]

FINDINGS: Lung bases are clear.

There is mild fatty infiltration near the fissure for the
ligamentum teres.  Liver otherwise appears normal.  There is no
biliary duct dilatation.

Spleen, pancreas, adrenals, and kidneys appear normal.

In the pelvis, there is no mass or fluid collection.

The appendix is dilated with extensive periappendiceal inflammation
consistent with acute appendicitis. There is a nearby thickening of
the terminal ileal wall, probably due to the inflammation from the
appendicitis.

There is no bowel obstruction.  No free air or portal venous air.

There is no ascites, adenopathy, or abscess in the pelvis.  Aorta
is nonaneurysmal.  There are no blastic or lytic bone lesions.
IMPRESSION: Evidence of acute appendicitis.  There is thickening of the wall of
the terminal ileum, probably due to the surrounding periappendiceal
inflammation.

Critical Value/emergent results were called by telephone at the
time of interpretation on May 30, 2013 at t[DATE] p.m. to Bandi
Grevy, who verbally acknowledged these results.

## 2015-09-26 HISTORY — PX: WISDOM TOOTH EXTRACTION: SHX21

## 2017-04-02 ENCOUNTER — Encounter: Payer: Self-pay | Admitting: Physician Assistant

## 2017-04-02 ENCOUNTER — Ambulatory Visit (INDEPENDENT_AMBULATORY_CARE_PROVIDER_SITE_OTHER): Payer: BLUE CROSS/BLUE SHIELD | Admitting: Physician Assistant

## 2017-04-02 ENCOUNTER — Other Ambulatory Visit (HOSPITAL_COMMUNITY)
Admission: RE | Admit: 2017-04-02 | Discharge: 2017-04-02 | Disposition: A | Discharge status: Home / Self Care | Payer: BLUE CROSS/BLUE SHIELD | Source: Ambulatory Visit | Attending: Physician Assistant | Admitting: Physician Assistant

## 2017-04-02 VITALS — BP 124/80 | HR 79 | Temp 98.6°F | Ht 68.0 in | Wt 318.5 lb

## 2017-04-02 DIAGNOSIS — Z0001 Encounter for general adult medical examination with abnormal findings: Secondary | ICD-10-CM | POA: Diagnosis present

## 2017-04-02 DIAGNOSIS — Z202 Contact with and (suspected) exposure to infections with a predominantly sexual mode of transmission: Secondary | ICD-10-CM | POA: Diagnosis present

## 2017-04-02 DIAGNOSIS — Z13220 Encounter for screening for lipoid disorders: Secondary | ICD-10-CM | POA: Diagnosis not present

## 2017-04-02 DIAGNOSIS — R7303 Prediabetes: Secondary | ICD-10-CM

## 2017-04-02 DIAGNOSIS — Z6841 Body Mass Index (BMI) 40.0 and over, adult: Secondary | ICD-10-CM

## 2017-04-02 DIAGNOSIS — L309 Dermatitis, unspecified: Secondary | ICD-10-CM

## 2017-04-02 DIAGNOSIS — Z23 Encounter for immunization: Secondary | ICD-10-CM | POA: Diagnosis not present

## 2017-04-02 MED ORDER — TRIAMCINOLONE ACETONIDE 0.1 % EX CREA: 1.0000 "application " | TOPICAL_CREAM | Freq: Two times a day (BID) | CUTANEOUS | 0 refills | Status: DC

## 2017-04-02 NOTE — Progress Notes (Addendum)
Scott Cunningham is a 26 y.o. male here to Establish Care and annual physical exam.  I acted as a Neurosurgeon for [employer], PA-C Corky Mull, LPN  History of Present Illness:   Chief Complaint  Patient presents with  . Establish Care    BC/BS  . Annual Exam    Acute Concerns: Possible STD exposure -- no history of STD in the past. Has sex with men. Currently asymptomatic and denies any known exposures to STDs. Has never used PrEP. Would like to be checked on everything today.  Chronic Issues: Morbid obesity -- was 384 lb approximately 1 year ago but has lost approx 66lb in weight by exercise and diet changes Pre-diabetes -- has been told in the past that he is pre-diabetic, doesn't recall his most recent HgbA1c Skin irritation -- patient has noted bilateral outer thigh skin irritation for quite some time, he does note that he has been applying OTC cream to the area without much improvement, denies any changes to his environment that could have worsened area  Health Maintenance: Immunizations -- needs Gardasil today  Diet -- tries to stay away from fried foods and red meats; drinks mostly water; small amounts juice Caffeine intake -- will have 2-3 coffees per week, no energy drink Sleep habits -- 6-7 hours a night, was screened for sleep apnea several years ago and was negative Exercise -- exercise 4-5 x a week, does mostly cardio and some weights. Weight -- Weight: (!) 318 lb 8 oz (144.5 kg) -- was 384 lb Mood -- no issues with anxiety or mood disorders  Depression screen PHQ 2/9 04/02/2017  Decreased Interest 0  Down, Depressed, Hopeless 0  PHQ - 2 Score 0    No flowsheet data found.  Other providers/specialists: Goes to an eye doctor and dentist, up to date   Past Medical History:  Diagnosis Date  . Appendicitis, acute 05/30/2013     Social History   Social History  . Marital status: Single    Spouse name: N/A  . Number of children: N/A  . Years of education:  N/A   Occupational History  . Not on file.   Social History Main Topics  . Smoking status: Never Smoker  . Smokeless tobacco: Never Used  . Alcohol use 1.2 oz/week    2 Shots of liquor per week  . Drug use: No  . Sexual activity: Yes   Other Topics Concern  . Not on file   Social History Narrative   Engineer x 3 years   Lives in [place]   Live by self   Single   Fun: party, hang with friends    Past Surgical History:  Procedure Laterality Date  . LAPAROSCOPIC APPENDECTOMY N/A 05/30/2013   Procedure: APPENDECTOMY LAPAROSCOPIC;  Surgeon: Cherylynn Ridges, MD;  Location: [facility] OR;  Service: General;  Laterality: N/A;  . TONSILLECTOMY    . TYMPANOSTOMY TUBE PLACEMENT    . WISDOM TOOTH EXTRACTION Right 2017    Family History  Problem Relation Age of Onset  . Diabetes Mother   . Diabetes Father   . Throat cancer Father   . Lung disease Maternal Grandmother   . Heart disease Maternal Grandfather   . Colon cancer Neg Hx     No Known Allergies   Current Medications:   Current Outpatient Prescriptions:  .  ibuprofen (ADVIL,MOTRIN) 200 MG tablet, Take 400 mg by mouth as needed., Disp: , Rfl:  .  triamcinolone cream (KENALOG) 0.1 %, Apply 1  application topically 2 (two) times daily., Disp: 30 g, Rfl: 0   Review of Systems:   Review of Systems  Constitutional: Negative for chills, fever, malaise/fatigue and weight loss.  HENT: Negative for hearing loss, sinus pain and sore throat.   Eyes: Negative for blurred vision.  Respiratory: Negative for cough and shortness of breath.   Cardiovascular: Negative for chest pain, palpitations and leg swelling.  Gastrointestinal: Negative for abdominal pain, constipation, diarrhea, heartburn, nausea and vomiting.  Genitourinary: Negative for dysuria, frequency and urgency.  Musculoskeletal: Negative for back pain, myalgias and neck pain.  Skin: Negative for itching and rash.  Neurological: Negative for dizziness, tingling, seizures,  loss of consciousness and headaches.  Endo/Heme/Allergies: Negative for polydipsia.  Psychiatric/Behavioral: Negative for depression. The patient is not nervous/anxious.     Vitals:   Vitals:   04/02/17 1525  BP: 124/80  Pulse: 79  Temp: 98.6 F (37 C)  TempSrc: Oral  SpO2: 98%  Weight: (!) 318 lb 8 oz (144.5 kg)  Height: 5\' 8"  (1.727 m)     Body mass index is 48.43 kg/m.  Physical Exam:   Physical Exam  Constitutional: He appears well-developed. He is cooperative.  Non-toxic appearance. He does not have a sickly appearance. He does not appear ill. No distress.  HENT:  Head: Normocephalic and atraumatic.  Right Ear: Tympanic membrane normal. Tympanic membrane is not erythematous, not retracted and not bulging.  Left Ear: Tympanic membrane normal. Tympanic membrane is not erythematous, not retracted and not bulging.  Nose: Nose normal.  Mouth/Throat: Uvula is midline and oropharynx is clear and moist.  Eyes: Conjunctivae, EOM and lids are normal. Pupils are equal, round, and reactive to light.  Neck: Trachea normal.  Cardiovascular: Normal rate, regular rhythm, S1 normal, S2 normal, normal heart sounds and normal pulses.   Pulmonary/Chest: Effort normal and breath sounds normal.  Abdominal: Normal appearance and bowel sounds are normal. There is no tenderness.  Lymphadenopathy:    He has no cervical adenopathy.  Neurological: He is alert.  Skin: Skin is warm, dry and intact.  Bilateral lateral thighs with patchy sand-paper like areas, no erythema or evidence of infection   Psychiatric: He has a normal mood and affect. His speech is normal and behavior is normal. Thought content normal.  Nursing note and vitals reviewed.    Assessment and Plan:    Scott Cunningham was seen today for establish care and annual exam.  Diagnoses and all orders for this visit:  Routine general medical examination at a health care facility Today patient counseled on age appropriate routine health  concerns for screening and prevention, each reviewed and up to date or declined. Immunizations reviewed and up to date or declined. Labs ordered and reviewed. Risk factors for depression reviewed and negative. Hearing function and visual acuity are intact. ADLs screened and addressed as needed. Functional ability and level of safety reviewed and appropriate. Education, counseling and referrals performed based on assessed risks today. Patient provided with a copy of personalized plan for preventive services. -     CBC with Differential/Platelet -     Comprehensive metabolic panel  Morbid obesity (HCC) Continue current weight loss regimen -- diet and exercise has been successful for patient. Follow-up with [place] as needed for weight loss needs. -     VITAMIN D 25 Hydroxy (Vit-D Deficiency, Fractures) -     Hemoglobin A1c  Possible exposure to STD Denies prior exposures. He is possibly interested in starting [employer] for PrEP. I gave  him some literature on this and discussed side effects. He will let us know if he is interested in doing this. Will check the following labs: -     HIV antibody -     Urine cytology ancillary only -     Hepatitis B core antibody, total -     RPR  Encounter for screening for lipid disorder -     Lipid panel  Pre-diabetes Repeat HgbA1c today. Request records to assess prior lab and trend. -     Hemoglobin A1c  Eczema Will try triamcinolone to area. Follow-up if no improvement in symptoms.  Need for prophylactic vaccination against human papillomavirus Need to restart the series, he is agreeable to do this. Will get first one today, second one in 1 month, and third 6 months after the second dose.   . Reviewed expectations re: course of current medical issues. . Discussed self-management of symptoms. . Outlined signs and symptoms indicating need for more acute intervention. . Patient verbalized understanding and all questions were answered. . See orders for this  visit as documented in the electronic medical record. . Patient received an After-Visit Summary.  CMA or LPN served as scribe during this visit. History, Physical, and Plan performed by medical provider. Documentation and orders reviewed and attested to.  Jarold MottoSamantha Junaid Wurzer, PA-C

## 2017-04-02 NOTE — Patient Instructions (Signed)
It was great meeting you!  CONGRATS on the weight loss!  We will call you with your lab results.  Let us know if you are interested in Truvada. Main side effects include: headache, abdominal pain, weight loss.   Emtricitabine; Tenofovir disoproxil fumarate tablets What is this medicine? EMTRICITABINE; TENOFOVIR DISOPROXIL FUMARATE (em tri SIT uh bean; te NOE fo veer) is two antiretroviral medicines in one tablet. It is used with other medicines to treat HIV and with safe sex practices to prevent HIV in high risk persons. This medicine is not a cure for HIV. This medicine may be used for other purposes; ask your health care provider or pharmacist if you have questions. COMMON BRAND NAME(S): Truvada What should I tell my health care provider before I take this medicine? They need to know if you have any of these conditions: -bone problems -kidney disease -liver disease -an unusual or allergic reaction to emtricitabine, tenofovir, other medicines, foods, dyes, or preservatives -pregnant or trying to get pregnant -breast-feeding How should I use this medicine? Take this medicine by mouth with a glass of water. Swallow tablets whole; do not cut, crush or chew. Follow the directions on the prescription label. You can take it with or without food. If it upsets your stomach, take it with food. Take your medicine at regular intervals. Do not take your medicine more often than directed. For your anti-HIV therapy to work as well as possible, take each dose exactly as prescribed. Do not skip doses or stop your medicine even if you feel better. Skipping doses may make the HIV virus resistant to this medicine and other medicines. Do not stop taking except on your doctor's advice. Talk to your pediatrician regarding the use of this medicine in children. While this drug may be prescribed for selected conditions, precautions do apply. Overdosage: If you think you have taken too much of this medicine contact  a poison control center or emergency room at once. NOTE: This medicine is only for you. Do not share this medicine with others. What if I miss a dose? If you miss a dose, take it as soon as you can. If it is almost time for your next dose, take only that dose. Do not take double or extra doses. What may interact with this medicine? Do not take this medicine with any of the following medications: -adefovir -any medicine that contains lamivudine -any medicine that contains emtricitabine or tenofovir This medicine may also interact with the following medications: -atazanavir -didanosine, ddI -lopinavir; ritonavir -medicines for viral infections like cidofovir, acyclovir, valacyclovir, ganciclovir, valganciclovir -saquinavir This list may not describe all possible interactions. Give your health care provider a list of all the medicines, herbs, non-prescription drugs, or dietary supplements you use. Also tell them if you smoke, drink alcohol, or use illegal drugs. Some items may interact with your medicine. What should I watch for while using this medicine? Visit your doctor or health care professional for regular check ups. Discuss any new symptoms with your doctor. You will need to have important blood work done while on this medicine. HIV is spread to others through sexual or blood contact. Talk to your doctor about how to stop the spread of HIV. If you have hepatitis B, talk to your doctor if you plan to stop this medicine. The symptoms of hepatitis B may get worse if you stop this medicine. What side effects may I notice from receiving this medicine? Side effects that you should report to your doctor or  health care professional as soon as possible: -allergic reactions like skin rash, itching or hives, swelling of the face, lips, or tongue -breathing difficulties -dark urine -general ill feeling or flu-like symptoms -light-colored stools -loss of appetite -nausea, vomiting, unusual stomach  upset or pain -right upper belly pain -trouble passing urine or change in the amount of urine -unusually weak or tired -yellowing of the eyes or skin Side effects that usually do not require medical attention (report to your doctor or health care professional if they continue or are bothersome): -diarrhea -dizziness -headache -skin discoloration on the hands or feet This list may not describe all possible side effects. Call your doctor for medical advice about side effects. You may report side effects to FDA at 1-800-FDA-1088. Where should I keep my medicine? Keep out of the reach of children. Store at room temperature between 15 and 30 degrees C (59 and 86 degrees F). Throw away any unused medicine after the expiration date. NOTE: This sheet is a summary. It may not cover all possible information. If you have questions about this medicine, talk to your doctor, pharmacist, or health care provider.  2018 Elsevier/Gold Standard (2016-01-18 09:46:32)    Health Maintenance, Male A healthy lifestyle and preventive care is important for your health and wellness. Ask your health care provider about what schedule of regular examinations is right for you. What should I know about weight and diet? Eat a Healthy Diet  Eat plenty of vegetables, fruits, whole grains, low-fat dairy products, and lean protein.  Do not eat a lot of foods high in solid fats, added sugars, or salt.  Maintain a Healthy Weight Regular exercise can help you achieve or maintain a healthy weight. You should:  Do at least 150 minutes of exercise each week. The exercise should increase your heart rate and make you sweat (moderate-intensity exercise).  Do strength-training exercises at least twice a week.  Watch Your Levels of Cholesterol and Blood Lipids  Have your blood tested for lipids and cholesterol every 5 years starting at 25 years of age. If you are at high risk for heart disease, you should start having your  blood tested when you are 25 years old. You may need to have your cholesterol levels checked more often if: ? Your lipid or cholesterol levels are high. ? You are older than 25 years of age. ? You are at high risk for heart disease.  What should I know about cancer screening? Many types of cancers can be detected early and may often be prevented. Lung Cancer  You should be screened every year for lung cancer if: ? You are a current smoker who has smoked for at least 30 years. ? You are a former smoker who has quit within the past 15 years.  Talk to your health care provider about your screening options, when you should start screening, and how often you should be screened.  Colorectal Cancer  Routine colorectal cancer screening usually begins at 25 years of age and should be repeated every 5-10 years until you are 25 years old. You may need to be screened more often if early forms of precancerous polyps or small growths are found. Your health care provider may recommend screening at an earlier age if you have risk factors for colon cancer.  Your health care provider may recommend using home test kits to check for hidden blood in the stool.  A small camera at the end of a tube can be used to examine  your colon (sigmoidoscopy or colonoscopy). This checks for the earliest forms of colorectal cancer.  Prostate and Testicular Cancer  Depending on your age and overall health, your health care provider may do certain tests to screen for prostate and testicular cancer.  Talk to your health care provider about any symptoms or concerns you have about testicular or prostate cancer.  Skin Cancer  Check your skin from head to toe regularly.  Tell your health care provider about any new moles or changes in moles, especially if: ? There is a change in a mole's size, shape, or color. ? You have a mole that is larger than a pencil eraser.  Always use sunscreen. Apply sunscreen liberally and  repeat throughout the day.  Protect yourself by wearing long sleeves, pants, a wide-brimmed hat, and sunglasses when outside.  What should I know about heart disease, diabetes, and high blood pressure?  If you are 31-95 years of age, have your blood pressure checked every 3-5 years. If you are 11 years of age or older, have your blood pressure checked every year. You should have your blood pressure measured twice-once when you are at a hospital or clinic, and once when you are not at a hospital or clinic. Record the average of the two measurements. To check your blood pressure when you are not at a hospital or clinic, you can use: ? An automated blood pressure machine at a pharmacy. ? A home blood pressure monitor.  Talk to your health care provider about your target blood pressure.  If you are between 93-43 years old, ask your health care provider if you should take aspirin to prevent heart disease.  Have regular diabetes screenings by checking your fasting blood sugar level. ? If you are at a normal weight and have a low risk for diabetes, have this test once every three years after the age of 32. ? If you are overweight and have a high risk for diabetes, consider being tested at a younger age or more often.  A one-time screening for abdominal aortic aneurysm (AAA) by ultrasound is recommended for men aged 65-75 years who are current or former smokers. What should I know about preventing infection? Hepatitis B If you have a higher risk for hepatitis B, you should be screened for this virus. Talk with your health care provider to find out if you are at risk for hepatitis B infection. Hepatitis C Blood testing is recommended for:  Everyone born from [birth year] through 1964.  Anyone with known risk factors for hepatitis C.  Sexually Transmitted Diseases (STDs)  You should be screened each year for STDs including gonorrhea and chlamydia if: ? You are sexually active and are younger than 25  years of age. ? You are older than 24 years of age and your health care provider tells you that you are at risk for this type of infection. ? Your sexual activity has changed since you were last screened and you are at an increased risk for chlamydia or gonorrhea. Ask your health care provider if you are at risk.  Talk with your health care provider about whether you are at high risk of being infected with HIV. Your health care provider may recommend a prescription medicine to help prevent HIV infection.  What else can I do?  Schedule regular health, dental, and eye exams.  Stay current with your vaccines (immunizations).  Do not use any tobacco products, such as cigarettes, chewing tobacco, and e-cigarettes. If you need help  quitting, ask your health care provider.  Limit alcohol intake to no more than 2 drinks per day. One drink equals 12 ounces of beer, 5 ounces of wine, or 1 ounces of hard liquor.  Do not use street drugs.  Do not share needles.  Ask your health care provider for help if you need support or information about quitting drugs.  Tell your health care provider if you often feel depressed.  Tell your health care provider if you have ever been abused or do not feel safe at home. This information is not intended to replace advice given to you by your health care provider. Make sure you discuss any questions you have with your health care provider. Document Released: 03/09/2008 Document Revised: 05/10/2016 Document Reviewed: 06/15/2015 Elsevier Interactive Patient Education  [employer].

## 2017-04-03 LAB — LIPID PANEL
Cholesterol: 167 mg/dL (ref 0–200)
HDL: 38.3 mg/dL — AB (ref >39.00)
LDL Cholesterol: 105 mg/dL — ABNORMAL HIGH (ref 0–99)
NonHDL: 128.48
Total CHOL/HDL Ratio: 4
Triglycerides: 115 mg/dL (ref 0.0–149.0)
VLDL: 23 mg/dL (ref 0.0–40.0)

## 2017-04-03 LAB — VITAMIN D 25 HYDROXY (VIT D DEFICIENCY, FRACTURES): VITD: 25.24 ng/mL — AB (ref 30.00–100.00)

## 2017-04-03 LAB — CBC WITH DIFFERENTIAL/PLATELET
Basophils Absolute: 0.1 10*3/uL (ref 0.0–0.1)
Basophils Relative: 1.1 % (ref 0.0–3.0)
EOS PCT: 2.1 % (ref 0.0–5.0)
Eosinophils Absolute: 0.1 10*3/uL (ref 0.0–0.7)
HCT: 46.1 % (ref 39.0–52.0)
HEMOGLOBIN: 15.2 g/dL (ref 13.0–17.0)
Lymphocytes Relative: 44.5 % (ref 12.0–46.0)
Lymphs Abs: 2.7 10*3/uL (ref 0.7–4.0)
MCHC: 33 g/dL (ref 30.0–36.0)
MCV: 83.4 fl (ref 78.0–100.0)
Monocytes Absolute: 0.7 10*3/uL (ref 0.1–1.0)
Monocytes Relative: 11.2 % (ref 3.0–12.0)
Neutro Abs: 2.5 10*3/uL (ref 1.4–7.7)
Neutrophils Relative %: 41.1 % — ABNORMAL LOW (ref 43.0–77.0)
Platelets: 240 10*3/uL (ref 150.0–400.0)
RBC: 5.53 Mil/uL (ref 4.22–5.81)
RDW: 14 % (ref 11.5–15.5)
WBC: 6.1 10*3/uL (ref 4.0–10.5)

## 2017-04-03 LAB — COMPREHENSIVE METABOLIC PANEL
ALBUMIN: 4.7 g/dL (ref 3.5–5.2)
ALK PHOS: 51 U/L (ref 39–117)
ALT: 27 U/L (ref 0–53)
AST: 28 U/L (ref 0–37)
BUN: 14 mg/dL (ref 6–23)
CO2: 28 mEq/L (ref 19–32)
Calcium: 10.1 mg/dL (ref 8.4–10.5)
Chloride: 101 mEq/L (ref 96–112)
Creatinine, Ser: 1.11 mg/dL (ref 0.40–1.50)
GFR: 103.48 mL/min (ref >60.00)
Glucose, Bld: 83 mg/dL (ref 70–99)
POTASSIUM: 4.4 meq/L (ref 3.5–5.1)
Sodium: 138 mEq/L (ref 135–145)
TOTAL PROTEIN: 7.7 g/dL (ref 6.0–8.3)
Total Bilirubin: 0.5 mg/dL (ref 0.2–1.2)

## 2017-04-03 LAB — HIV ANTIBODY (ROUTINE TESTING W REFLEX): HIV: NONREACTIVE

## 2017-04-03 LAB — HEMOGLOBIN A1C: Hgb A1c MFr Bld: 5.9 % (ref 4.6–6.5)

## 2017-04-03 LAB — HEPATITIS B CORE ANTIBODY, TOTAL: Hep B Core Total Ab: NONREACTIVE

## 2017-04-03 LAB — RPR

## 2017-04-05 LAB — URINE CYTOLOGY ANCILLARY ONLY
Chlamydia: NEGATIVE
Neisseria Gonorrhea: NEGATIVE

## 2017-05-14 ENCOUNTER — Encounter: Payer: Self-pay | Admitting: Physician Assistant

## 2017-05-14 ENCOUNTER — Ambulatory Visit (INDEPENDENT_AMBULATORY_CARE_PROVIDER_SITE_OTHER): Payer: BLUE CROSS/BLUE SHIELD | Admitting: Physician Assistant

## 2017-05-14 VITALS — BP 130/80 | HR 73 | Ht 68.0 in | Wt 319.4 lb

## 2017-05-14 DIAGNOSIS — F419 Anxiety disorder, unspecified: Secondary | ICD-10-CM | POA: Diagnosis not present

## 2017-05-14 DIAGNOSIS — F320 Major depressive disorder, single episode, mild: Secondary | ICD-10-CM

## 2017-05-14 LAB — TSH: TSH: 2.21 u[IU]/mL (ref 0.35–4.50)

## 2017-05-14 MED ORDER — SERTRALINE HCL 25 MG PO TABS: ORAL_TABLET | ORAL | 0 refills | Status: DC

## 2017-05-14 NOTE — Patient Instructions (Addendum)
It was great to see you today!  Start Zoloft 25 mg daily. After two weeks, may increase to 50 mg daily. Please do not exceed this dosage. Follow-up with me in 4-6 weeks regarding this medication, sooner if needed.  If you develop any thoughts of suicide, please tell someone and seek medical attention immediately.  Consider therapy.   Generalized Anxiety Disorder, Adult Generalized anxiety disorder (GAD) is a mental health disorder. People with this condition constantly worry about everyday events. Unlike normal anxiety, worry related to GAD is not triggered by a specific event. These worries also do not fade or get better with time. GAD interferes with life functions, including relationships, work, and school. GAD can vary from mild to severe. People with severe GAD can have intense waves of anxiety with physical symptoms (panic attacks). What are the causes? The exact cause of GAD is not known. What increases the risk? This condition is more likely to develop in:  Women.  People who have a family history of anxiety disorders.  People who are very shy.  People who experience very stressful life events, such as the death of a loved one.  People who have a very stressful family environment.  What are the signs or symptoms? People with GAD often worry excessively about many things in their lives, such as their health and family. They may also be overly concerned about:  Doing well at work.  Being on time.  Natural disasters.  Friendships.  Physical symptoms of GAD include:  Fatigue.  Muscle tension or having muscle twitches.  Trembling or feeling shaky.  Being easily startled.  Feeling like your heart is pounding or racing.  Feeling out of breath or like you cannot take a deep breath.  Having trouble falling asleep or staying asleep.  Sweating.  Nausea, diarrhea, or irritable bowel syndrome (IBS).  Headaches.  Trouble concentrating or remembering  facts.  Restlessness.  Irritability.  How is this diagnosed? Your health care provider can diagnose GAD based on your symptoms and medical history. You will also have a physical exam. The health care provider will ask specific questions about your symptoms, including how severe they are, when they started, and if they come and go. Your health care provider may ask you about your use of alcohol or drugs, including prescription medicines. Your health care provider may refer you to a mental health specialist for further evaluation. Your health care provider will do a thorough examination and may perform additional tests to rule out other possible causes of your symptoms. To be diagnosed with GAD, a person must have anxiety that:  Is out of his or her control.  Affects several different aspects of his or her life, such as work and relationships.  Causes distress that makes him or her unable to take part in normal activities.  Includes at least three physical symptoms of GAD, such as restlessness, fatigue, trouble concentrating, irritability, muscle tension, or sleep problems.  Before your health care provider can confirm a diagnosis of GAD, these symptoms must be present more days than they are not, and they must last for six months or longer. How is this treated? The following therapies are usually used to treat GAD:  Medicine. Antidepressant medicine is usually prescribed for long-term daily control. Antianxiety medicines may be added in severe cases, especially when panic attacks occur.  Talk therapy (psychotherapy). Certain types of talk therapy can be helpful in treating GAD by providing support, education, and guidance. Options include: ? Cognitive  behavioral therapy (CBT). People learn coping skills and techniques to ease their anxiety. They learn to identify unrealistic or negative thoughts and behaviors and to replace them with positive ones. ? Acceptance and commitment therapy (ACT).  This treatment teaches people how to be mindful as a way to cope with unwanted thoughts and feelings. ? Biofeedback. This process trains you to manage your body's response (physiological response) through breathing techniques and relaxation methods. You will work with a therapist while machines are used to monitor your physical symptoms.  Stress management techniques. These include yoga, meditation, and exercise.  A mental health specialist can help determine which treatment is best for you. Some people see improvement with one type of therapy. However, other people require a combination of therapies. Follow these instructions at home:  Take over-the-counter and prescription medicines only as told by your health care provider.  Try to maintain a normal routine.  Try to anticipate stressful situations and allow extra time to manage them.  Practice any stress management or self-calming techniques as taught by your health care provider.  Do not punish yourself for setbacks or for not making progress.  Try to recognize your accomplishments, even if they are small.  Keep all follow-up visits as told by your health care provider. This is important. Contact a health care provider if:  Your symptoms do not get better.  Your symptoms get worse.  You have signs of depression, such as: ? A persistently sad, cranky, or irritable mood. ? Loss of enjoyment in activities that used to bring you joy. ? Change in weight or eating. ? Changes in sleeping habits. ? Avoiding friends or family members. ? Loss of energy for normal tasks. ? Feelings of guilt or worthlessness. Get help right away if:  You have serious thoughts about hurting yourself or others. If you ever feel like you may hurt yourself or others, or have thoughts about taking your own life, get help right away. You can go to your nearest emergency department or call:  Your local emergency services (911 in the [place]  A suicide  crisis helpline, such as the National Suicide Prevention Lifeline at 872-425-9751. This is open 24 hours a day.  Summary  Generalized anxiety disorder (GAD) is a mental health disorder that involves worry that is not triggered by a specific event.  People with GAD often worry excessively about many things in their lives, such as their health and family.  GAD may cause physical symptoms such as restlessness, trouble concentrating, sleep problems, frequent sweating, nausea, diarrhea, headaches, and trembling or muscle twitching.  A mental health specialist can help determine which treatment is best for you. Some people see improvement with one type of therapy. However, other people require a combination of therapies. This information is not intended to replace advice given to you by your health care provider. Make sure you discuss any questions you have with your health care provider. Document Released: 01/06/2013 Document Revised: 08/01/2016 Document Reviewed: 08/01/2016 Elsevier Interactive Patient Education  [employer].

## 2017-05-14 NOTE — Progress Notes (Signed)
Scott Cunningham is a 26 y.o. male here for a new problem.  History of Present Illness:   Chief Complaint  Patient presents with  . Anxiety    HPI   Anxiety Patient is here to discuss anxiety. He is having excessive worrying, trouble relaxing, feeling easily annoyed, feeling afraid something bad may happen. This has been going on for a couple of months and it is getting worse with time. Has never been on any medicines in the past. No panic attacks. No SI/HI. Has trouble sleeping, uses melatonin - about 1-2 x a week, and when he uses this, it is effective for him. No excessive caffeine intake or recent dietary changes. Anxiety related to work, finances, relationships. No changes in ETOH intake or recent increase.  He was last here for a physical about 1 month ago. Labs were essentially normal. TSH was not performed.  GAD 7 : Generalized Anxiety Score 05/14/2017  Nervous, Anxious, on Edge 3  Control/stop worrying 3  Worry too much - different things 3  Trouble relaxing 3  Restless 2  Easily annoyed or irritable 3  Afraid - awful might happen 3  Total GAD 7 Score 20   Depression screen Scott Cunningham 2/9 05/14/2017 04/02/2017  Decreased Interest 1 0  Down, Depressed, Hopeless 3 0  PHQ - 2 Score 4 0  Altered sleeping 1 -  Tired, decreased energy 1 -  Change in appetite 1 -  Feeling bad or failure about yourself  2 -  Trouble concentrating 1 -  Moving slowly or fidgety/restless 0 -  Suicidal thoughts 0 -  PHQ-9 Score 10 -  Difficult doing work/chores Somewhat difficult -     Past Medical History:  Diagnosis Date  . Appendicitis, acute 05/30/2013     Social History   Social History  . Marital status: Single    Spouse name: N/A  . Number of children: N/A  . Years of education: N/A   Occupational History  . Not on file.   Social History Main Topics  . Smoking status: Never Smoker  . Smokeless tobacco: Never Used  . Alcohol use 1.2 oz/week    2 Shots of liquor per week  . Drug  use: No  . Sexual activity: Yes   Other Topics Concern  . Not on file   Social History Narrative   Engineer x 3 years   Lives in [place]   Live by self   Single   Fun: party, hang with friends    Past Surgical History:  Procedure Laterality Date  . LAPAROSCOPIC APPENDECTOMY N/A 05/30/2013   Procedure: APPENDECTOMY LAPAROSCOPIC;  Surgeon: Cherylynn Ridges, MD;  Location: [facility] OR;  Service: General;  Laterality: N/A;  . TONSILLECTOMY    . TYMPANOSTOMY TUBE PLACEMENT    . WISDOM TOOTH EXTRACTION Right 2017    Family History  Problem Relation Age of Onset  . Diabetes Mother   . Diabetes Father   . Throat cancer Father   . Lung disease Maternal Grandmother   . Heart disease Maternal Grandfather   . Colon cancer Neg Hx     No Known Allergies  Current Medications:   Current Outpatient Prescriptions:  .  ibuprofen (ADVIL,MOTRIN) 200 MG tablet, Take 400 mg by mouth as needed., Disp: , Rfl:  .  sertraline (ZOLOFT) 25 MG tablet, Start 25 mg daily. After 2-3 weeks, may increase to 50 mg if desired. Do not exceed 50 mg., Disp: 60 tablet, Rfl: 0 .  triamcinolone cream (KENALOG)  0.1 %, Apply 1 application topically 2 (two) times daily., Disp: 30 g, Rfl: 0   Review of Systems:   Review of Systems  Constitutional: Negative for chills, fever, malaise/fatigue and weight loss.  HENT: Negative for hearing loss.   Respiratory: Negative for shortness of breath.   Cardiovascular: Negative for chest pain and palpitations.  Gastrointestinal: Negative for abdominal pain, heartburn, nausea and vomiting.  Musculoskeletal: Negative for myalgias.  Neurological: Negative for dizziness, tingling, tremors and headaches.  Psychiatric/Behavioral: Positive for depression. The patient is nervous/anxious and has insomnia.     Vitals:   Vitals:   05/14/17 0945  BP: 130/80  Pulse: 73  SpO2: 97%  Weight: (!) 319 lb 6.4 oz (144.9 kg)  Height: 5\' 8"  (1.727 m)     Body mass index is 48.56  kg/m.  Physical Exam:   Physical Exam  Constitutional: He appears well-developed. He is cooperative.  Non-toxic appearance. He does not have a sickly appearance. He does not appear ill. No distress.  Cardiovascular: Normal rate, regular rhythm, S1 normal, S2 normal, normal heart sounds and normal pulses.   No LE edema  Pulmonary/Chest: Effort normal and breath sounds normal.  Neurological: He is alert. GCS eye subscore is 4. GCS verbal subscore is 5. GCS motor subscore is 6.  Skin: Skin is warm, dry and intact.  Psychiatric: His speech is normal and behavior is normal. His mood appears anxious.  Nursing note and vitals reviewed.     Assessment and Plan:    [name] was seen today for anxiety.  Diagnoses and all orders for this visit:  Anxiety -     TSH  Depression, major, single episode, mild (HCC)  Other orders -     sertraline (ZOLOFT) 25 MG tablet; Start 25 mg daily. After 2-3 weeks, may increase to 50 mg if desired. Do not exceed 50 mg.   Discussed starting SSRI, discussed risks and benefits. He is agreeable to starting Zoloft 25 mg. Advised him to start 25 mg daily x 2 weeks, may increase to 50 mg daily if he would like. Follow-up in 4-6 weeks to review medication efficacy, sooner if needed. No acute concerns for SI/HI at this time.Labs have been recently checked, except for TSH -- will check today to r/o organic cause. I discussed with patient that if they develop any SI, to tell someone immediately and seek medical attention.   . Reviewed expectations re: course of current medical issues. . Discussed self-management of symptoms. . Outlined signs and symptoms indicating need for more acute intervention. . Patient verbalized understanding and all questions were answered. . See orders for this visit as documented in the electronic medical record. . Patient received an After-Visit Summary.   Jarold Motto, PA-C

## 2017-06-12 ENCOUNTER — Other Ambulatory Visit: Payer: Self-pay | Admitting: *Deleted

## 2017-06-12 MED ORDER — SERTRALINE HCL 25 MG PO TABS: ORAL_TABLET | ORAL | 0 refills | Status: DC

## 2017-06-13 ENCOUNTER — Telehealth: Payer: Self-pay | Admitting: Physician Assistant

## 2017-06-13 NOTE — Telephone Encounter (Signed)
Error

## 2017-06-14 ENCOUNTER — Ambulatory Visit: Payer: BLUE CROSS/BLUE SHIELD | Admitting: Physician Assistant

## 2017-06-25 ENCOUNTER — Ambulatory Visit: Payer: BLUE CROSS/BLUE SHIELD | Admitting: Family Medicine

## 2017-12-20 ENCOUNTER — Ambulatory Visit (INDEPENDENT_AMBULATORY_CARE_PROVIDER_SITE_OTHER): Payer: BLUE CROSS/BLUE SHIELD | Admitting: Family Medicine

## 2017-12-20 ENCOUNTER — Encounter: Payer: Self-pay | Admitting: Family Medicine

## 2017-12-20 VITALS — BP 98/62 | HR 104 | Temp 99.0°F | Ht 68.0 in | Wt 316.0 lb

## 2017-12-20 DIAGNOSIS — L989 Disorder of the skin and subcutaneous tissue, unspecified: Secondary | ICD-10-CM | POA: Diagnosis not present

## 2017-12-20 DIAGNOSIS — F419 Anxiety disorder, unspecified: Secondary | ICD-10-CM | POA: Insufficient documentation

## 2017-12-20 DIAGNOSIS — K649 Unspecified hemorrhoids: Secondary | ICD-10-CM | POA: Diagnosis not present

## 2017-12-20 DIAGNOSIS — F320 Major depressive disorder, single episode, mild: Secondary | ICD-10-CM | POA: Diagnosis not present

## 2017-12-20 MED ORDER — HYDROCORTISONE 2.5 % RE CREA: 1.0000 "application " | TOPICAL_CREAM | Freq: Two times a day (BID) | RECTAL | 0 refills | Status: AC

## 2017-12-20 MED ORDER — SERTRALINE HCL 50 MG PO TABS: ORAL_TABLET | ORAL | 3 refills | Status: DC

## 2017-12-20 NOTE — Progress Notes (Signed)
   Subjective:  Scott Cunningham is a 27 y.o. male who presents today with a chief complaint of skin lesion.   HPI:  Anal skin lesion, New Problem Started about a month ago.  Stable over that time.  Located on his anus.  No obvious precipitating events.  Does not hurt.  Does not bleed.  Has not tried any treatments for this.  No obvious alleviating or aggravating factors.  Scrotal skin lesion, new problem Present for several months.  Has several areas of irritation on his scrotum.  Otherwise these lesions are asymptomatic.  He has not tried any treatments for this.  Depression/Anxiety, established problems, uncontrolled Patient seen about 7 months ago and started on Zoloft.  He stopped this because he did not think it was working.  Not been on anything for several months.  Depression screen PHQ 2/9 12/20/2017  Decreased Interest 1  Down, Depressed, Hopeless 1  PHQ - 2 Score 2  Altered sleeping 3  Tired, decreased energy 3  Change in appetite 3  Feeling bad or failure about yourself  1  Trouble concentrating 0  Moving slowly or fidgety/restless 0  Suicidal thoughts 0  PHQ-9 Score 12  Difficult doing work/chores -    GAD 7 : Generalized Anxiety Score 12/20/2017  Nervous, Anxious, on Edge 1  Control/stop worrying 2  Worry too much - different things 2  Trouble relaxing 1  Restless 1  Easily annoyed or irritable 3  Afraid - awful might happen 1  Total GAD 7 Score 11   ROS: Per HPI  PMH: He reports that he has never smoked. He has never used smokeless tobacco. He reports that he drinks about 1.2 oz of alcohol per week. He reports that he does not use drugs.  Objective:  Physical Exam: BP 98/62   Pulse (!) 104   Temp 99 F (37.2 C)   Ht 5\' 8"  (1.727 m)   Wt (!) 316 lb (143.3 kg)   SpO2 97%   BMI 48.05 kg/m   Gen: NAD, resting comfortably CV: RRR with no murmurs appreciated Pulm: NWOB, CTAB with no crackles, wheezes, or rhonchi GU: 1-1-1/2 cm hemorrhoid at right  posterior anus.  Nontender to palpation.  No drainage.  Several small, discrete areas of hypopigmentation on scrotum. Psych: Normal affect and thought content  Assessment/Plan:  Hemorrhoids Discussed dietary modifications.  Recommended good oral hydration and fiber supplementation.  Will start hydrocortisone cream to help with symptoms and to hopefully shrink size.  May ultimately need referral to surgery for surgical excision.  Skin lesion No red flag signs or symptoms. Possibly tinea cruris, but patient does not have any burning or itching. Not consistent with STI. Possibly folliculitis or eczema. Recommended patient try OTC antifungal cream to see if it improve. Consider low dose hydrocortisone cream if no improvement with antifungal.   Depression, major, single episode, mild (HCC) Restart zoloft at 50mg  daily.  Increase by increments of 50 mg each week until he follows up with me in 3-4 weeks.  Anxiety See depression A/P.  Start Zoloft.  Follow-up in 3-4 weeks.  Katina Degreealeb M. Jimmey RalphParker, MD 12/20/2017 11:52 AM

## 2017-12-20 NOTE — Assessment & Plan Note (Signed)
No red flag signs or symptoms. Possibly tinea cruris, but patient does not have any burning or itching. Not consistent with STI. Possibly folliculitis or eczema. Recommended patient try OTC antifungal cream to see if it improve. Consider low dose hydrocortisone cream if no improvement with antifungal.

## 2017-12-20 NOTE — Patient Instructions (Addendum)
You have a hemorrhoid. Please start a fiber supplement such as metamucil or benefiber. Please use the hydrocortisone cream.  Start the zoloft.  Take 1 pill daily for 1 week.  Then increase to 2 pills daily for 1 week.  Then increase to 3 pills daily for 1 week.  Come back and see me in 3-4 weeks.  You can try using hydrocortisone cream to the areas on your scrotum as well.  Take care, Dr Jimmey RalphParker

## 2017-12-20 NOTE — Assessment & Plan Note (Signed)
See depression A/P.  Start Zoloft.  Follow-up in 3-4 weeks.

## 2017-12-20 NOTE — Assessment & Plan Note (Signed)
Restart zoloft at 50mg  daily.  Increase by increments of 50 mg each week until he follows up with me in 3-4 weeks.

## 2017-12-20 NOTE — Assessment & Plan Note (Signed)
Discussed dietary modifications.  Recommended good oral hydration and fiber supplementation.  Will start hydrocortisone cream to help with symptoms and to hopefully shrink size.  May ultimately need referral to surgery for surgical excision.

## 2018-01-15 ENCOUNTER — Ambulatory Visit: Payer: BLUE CROSS/BLUE SHIELD | Admitting: Adult Health

## 2018-01-21 ENCOUNTER — Ambulatory Visit: Payer: BLUE CROSS/BLUE SHIELD | Admitting: Family Medicine

## 2018-01-21 DIAGNOSIS — Z0289 Encounter for other administrative examinations: Secondary | ICD-10-CM

## 2018-01-22 ENCOUNTER — Other Ambulatory Visit: Payer: Self-pay | Admitting: Family Medicine

## 2018-01-22 NOTE — Telephone Encounter (Signed)
OK to give 1 month supply but needs to follow up with me for refills.  Katina Degree. Jimmey Ralph, MD 01/22/2018 1:12 PM

## 2018-01-22 NOTE — Telephone Encounter (Signed)
Please advise.  Patient did not show for his appointment on 01/21/2018.

## 2018-01-23 ENCOUNTER — Telehealth: Payer: Self-pay | Admitting: Family Medicine

## 2018-01-23 NOTE — Telephone Encounter (Signed)
Scheduled at 8:20 ON 01/24/2018.

## 2018-01-23 NOTE — Telephone Encounter (Signed)
See note

## 2018-01-23 NOTE — Telephone Encounter (Signed)
Copied from CRM [id number]. Topic: Quick Communication - See Telephone Encounter >> Jan 23, 2018  8:51 AM Rudi Coco, NT wrote: CRM for notification. See Telephone encounter for: 01/23/18.  Pt. Has a questions about med. sertraline (ZOLOFT) 50 MG tablet [[id number]] 4 pills a day (doesn't have enough to last until refill date. [phone]

## 2018-01-23 NOTE — Telephone Encounter (Signed)
Scheduled patient for 01/24/2018 at 8:20

## 2018-01-24 ENCOUNTER — Encounter: Payer: Self-pay | Admitting: Family Medicine

## 2018-01-24 ENCOUNTER — Ambulatory Visit (INDEPENDENT_AMBULATORY_CARE_PROVIDER_SITE_OTHER): Payer: BLUE CROSS/BLUE SHIELD | Admitting: Family Medicine

## 2018-01-24 DIAGNOSIS — F419 Anxiety disorder, unspecified: Secondary | ICD-10-CM | POA: Diagnosis not present

## 2018-01-24 DIAGNOSIS — F320 Major depressive disorder, single episode, mild: Secondary | ICD-10-CM | POA: Diagnosis not present

## 2018-01-24 MED ORDER — CITALOPRAM HYDROBROMIDE 40 MG PO TABS: 40.0000 mg | ORAL_TABLET | Freq: Every day | ORAL | 0 refills | Status: DC

## 2018-01-24 NOTE — Patient Instructions (Signed)
I am sorry the Zoloft did not work well for you.  We will stop this medication today.  Please start Celexa 40 mg daily.  Please let me know if you have any side effects this medication.  I think would also be a good idea for you to get into see our behavioral specialist.  You can call the number to schedule an appointment if you are interested in this.  Please come back to see me in 3 to 4 weeks, or sooner as needed.  Take care, Dr. Jimmey Ralph

## 2018-01-24 NOTE — Assessment & Plan Note (Signed)
See depression A/P.  Switch from Zoloft to Celexa today.

## 2018-01-24 NOTE — Progress Notes (Signed)
   Subjective:  Scott Cunningham is a 27 y.o. male who presents today with a chief complaint of depression/anxiety follow-up.   HPI:  Depression/Anxiety, established problems, Worsening Patient seen about a month ago for both of these problems.  At that time was not on any medication.  We restarted him on Zoloft.  He has been able to increase his dose to 200 mg daily over the last couple weeks.  He has not noticed any significant improvement in either his depressive or anxiety symptoms.  He has additionally noted some side effects including lower libido and muscle aches.  Denies any active SI or HI.  Depression screen PHQ 2/9 01/24/2018  Decreased Interest 3  Down, Depressed, Hopeless 3  PHQ - 2 Score 6  Altered sleeping 3  Tired, decreased energy 3  Change in appetite 2  Feeling bad or failure about yourself  3  Trouble concentrating 3  Moving slowly or fidgety/restless 0  Suicidal thoughts 0  PHQ-9 Score 20  Difficult doing work/chores -    GAD 7 : Generalized Anxiety Score 01/24/2018  Nervous, Anxious, on Edge 3  Control/stop worrying 3  Worry too much - different things 3  Trouble relaxing 3  Restless 3  Easily annoyed or irritable 3  Afraid - awful might happen 2  Total GAD 7 Score 20   Morbid obesity, established problem, improving Patient with several pound weight loss over the last month.  He has been more active and exercising more.  ROS: Per HPI  PMH: He reports that he has never smoked. He has never used smokeless tobacco. He reports that he drinks about 1.2 oz of alcohol per week. He reports that he does not use drugs.  Objective:  Physical Exam: BP 140/82 (BP Location: Left Arm)   Pulse 93   Temp 98.8 F (37.1 C)   Resp 14   Ht  (1.727 m)   Wt (!) 303 lb (137.4 kg)   SpO2 96%   BMI 46.07 kg/m   Wt Readings from Last 3 Encounters:  01/24/18 (!) 303 lb (137.4 kg)  12/20/17 (!) 316 lb (143.3 kg)  05/14/17 (!) 319 lb 6.4 oz (144.9 kg)  Gen: NAD,  resting comfortably CV: RRR with no murmurs appreciated Pulm: NWOB, CTAB with no crackles, wheezes, or rhonchi Neuro: Grossly normal, moves all extremities Psych: Normal affect and thought content  Assessment/Plan:  Depression, major, single episode, mild (HCC) PHQ 9 continues to be significantly elevated.  We will stop Zoloft today and switch to Celexa 40 mg daily.  He will follow-up with me in 3 to 4 weeks.  If no improvement, would consider trial of SNRI or atypical antidepressant such as bupropion or Trintellix.  Patient may be interested in psychotherapy.  Information was given to call and schedule appointment with our behavioral specialist.  Anxiety See depression A/P.  Switch from Zoloft to Celexa today.  Morbid obesity (HCC) BMI 46.07 today. Patient has lost about 13 pounds over the last 4 to 5 weeks.  Congratulated patient on weight loss and encouraged continued lifestyle modifications.  We will follow-up in 3 to 4 weeks.   Katina Degree. Jimmey Ralph, MD 01/24/2018 8:35 AM

## 2018-01-24 NOTE — Assessment & Plan Note (Addendum)
BMI 46.07 today. Patient has lost about 13 pounds over the last 4 to 5 weeks.  Congratulated patient on weight loss and encouraged continued lifestyle modifications.  We will follow-up in 3 to 4 weeks.

## 2018-01-24 NOTE — Assessment & Plan Note (Addendum)
PHQ 9 continues to be significantly elevated.  We will stop Zoloft today and switch to Celexa 40 mg daily.  He will follow-up with me in 3 to 4 weeks.  If no improvement, would consider trial of SNRI or atypical antidepressant such as bupropion or Trintellix.  Patient may be interested in psychotherapy.  Information was given to call and schedule appointment with our behavioral specialist.

## 2018-01-25 ENCOUNTER — Encounter: Payer: Self-pay | Admitting: Family Medicine

## 2018-02-21 ENCOUNTER — Other Ambulatory Visit: Payer: Self-pay

## 2018-02-21 ENCOUNTER — Encounter: Payer: Self-pay | Admitting: Family Medicine

## 2018-02-21 MED ORDER — CITALOPRAM HYDROBROMIDE 40 MG PO TABS: 40.0000 mg | ORAL_TABLET | Freq: Every day | ORAL | 1 refills | Status: AC

## 2018-02-25 ENCOUNTER — Ambulatory Visit: Payer: BLUE CROSS/BLUE SHIELD | Admitting: Family Medicine

## 2018-07-12 ENCOUNTER — Other Ambulatory Visit: Payer: Self-pay | Admitting: Family Medicine

## 2022-06-19 ENCOUNTER — Encounter: Payer: Self-pay | Admitting: *Deleted

## 2022-09-07 ENCOUNTER — Encounter: Payer: Self-pay | Admitting: *Deleted
